# Patient Record
Sex: Female | Born: 1965 | Race: Asian | Hispanic: No | Marital: Married | Smoking: Never smoker
Health system: Southern US, Community
[De-identification: ages and names within clinical notes are randomized; demographics above are authoritative.]

## PROBLEM LIST (undated history)

## (undated) DIAGNOSIS — R7303 Prediabetes: Secondary | ICD-10-CM

## (undated) DIAGNOSIS — C50919 Malignant neoplasm of unspecified site of unspecified female breast: Secondary | ICD-10-CM

## (undated) DIAGNOSIS — E785 Hyperlipidemia, unspecified: Secondary | ICD-10-CM

## (undated) DIAGNOSIS — E079 Disorder of thyroid, unspecified: Secondary | ICD-10-CM

## (undated) DIAGNOSIS — I10 Essential (primary) hypertension: Secondary | ICD-10-CM

## (undated) HISTORY — DX: Hyperlipidemia, unspecified: E78.5

## (undated) HISTORY — DX: Malignant neoplasm of unspecified site of unspecified female breast: C50.919

## (undated) HISTORY — PX: TONSILECTOMY, ADENOIDECTOMY, BILATERAL MYRINGOTOMY AND TUBES: SHX2538

## (undated) HISTORY — DX: Disorder of thyroid, unspecified: E07.9

---

## 1998-04-05 ENCOUNTER — Other Ambulatory Visit: Admission: RE | Admit: 1998-04-05 | Discharge: 1998-04-05 | Payer: Self-pay | Admitting: Obstetrics and Gynecology

## 1999-04-10 ENCOUNTER — Other Ambulatory Visit: Admission: RE | Admit: 1999-04-10 | Discharge: 1999-04-10 | Payer: Self-pay | Admitting: Obstetrics and Gynecology

## 2000-08-20 ENCOUNTER — Other Ambulatory Visit: Admission: RE | Admit: 2000-08-20 | Discharge: 2000-08-20 | Payer: Self-pay | Admitting: Obstetrics and Gynecology

## 2001-10-07 ENCOUNTER — Other Ambulatory Visit: Admission: RE | Admit: 2001-10-07 | Discharge: 2001-10-07 | Payer: Self-pay | Admitting: Obstetrics and Gynecology

## 2002-10-21 ENCOUNTER — Other Ambulatory Visit: Admission: RE | Admit: 2002-10-21 | Discharge: 2002-10-21 | Payer: Self-pay | Admitting: Obstetrics and Gynecology

## 2003-12-05 ENCOUNTER — Other Ambulatory Visit: Admission: RE | Admit: 2003-12-05 | Discharge: 2003-12-05 | Payer: Self-pay | Admitting: Obstetrics and Gynecology

## 2004-07-08 ENCOUNTER — Encounter: Admission: RE | Admit: 2004-07-08 | Discharge: 2004-07-08 | Payer: Self-pay | Admitting: Internal Medicine

## 2004-12-05 ENCOUNTER — Other Ambulatory Visit: Admission: RE | Admit: 2004-12-05 | Discharge: 2004-12-05 | Payer: Self-pay | Admitting: Internal Medicine

## 2004-12-23 ENCOUNTER — Emergency Department (HOSPITAL_COMMUNITY): Admission: EM | Admit: 2004-12-23 | Discharge: 2004-12-23 | Payer: Self-pay | Admitting: Emergency Medicine

## 2005-11-03 ENCOUNTER — Encounter (HOSPITAL_COMMUNITY): Admission: RE | Admit: 2005-11-03 | Discharge: 2005-12-09 | Payer: Self-pay | Admitting: Family Medicine

## 2005-12-04 ENCOUNTER — Other Ambulatory Visit: Admission: RE | Admit: 2005-12-04 | Discharge: 2005-12-04 | Payer: Self-pay | Admitting: Obstetrics and Gynecology

## 2005-12-09 ENCOUNTER — Ambulatory Visit (HOSPITAL_COMMUNITY): Admission: RE | Admit: 2005-12-09 | Discharge: 2005-12-09 | Payer: Self-pay | Admitting: Endocrinology

## 2007-06-02 ENCOUNTER — Encounter: Admission: RE | Admit: 2007-06-02 | Discharge: 2007-06-02 | Payer: Self-pay | Admitting: Occupational Medicine

## 2010-10-01 ENCOUNTER — Emergency Department (HOSPITAL_COMMUNITY)
Admission: EM | Admit: 2010-10-01 | Discharge: 2010-10-01 | Payer: Self-pay | Source: Home / Self Care | Admitting: Family Medicine

## 2012-09-28 ENCOUNTER — Ambulatory Visit
Admission: RE | Admit: 2012-09-28 | Discharge: 2012-09-28 | Disposition: A | Discharge status: Home / Self Care | Payer: BC Managed Care – PPO | Source: Ambulatory Visit | Attending: Internal Medicine | Admitting: Internal Medicine

## 2012-09-28 ENCOUNTER — Other Ambulatory Visit: Payer: Self-pay | Admitting: Internal Medicine

## 2012-09-28 DIAGNOSIS — R0602 Shortness of breath: Secondary | ICD-10-CM

## 2012-09-28 DIAGNOSIS — R079 Chest pain, unspecified: Secondary | ICD-10-CM

## 2012-09-28 MED ORDER — IOHEXOL 350 MG/ML SOLN
125.0000 mL | Freq: Once | INTRAVENOUS | Status: AC | PRN
  Administered 2012-09-28: 125 mL via INTRAVENOUS

## 2013-03-06 IMAGING — CT CT ANGIO CHEST
2 of 6 series · 19 of 36 positions shown · IV contrast ([ID] OMNI 350)
Comparison: None.

CLINICAL DATA: Chest pain and short of breath.  Concern for
pulmonary embolism.

CT ANGIOGRAPHY CHEST
TECHNIQUE: Multidetector CT imaging of the chest using the
standard protocol during bolus administration of intravenous
contrast. Multiplanar reconstructed images including MIPs were
obtained and reviewed to evaluate the vascular anatomy.
Contrast: 125mL OMNIPAQUE IOHEXOL 350 MG/ML SOLN

[Series 6: pe thin 1.25 · axial · 0.70mm/px · z∈[-210,-6]mm · 18 of 228 slices shown]
[im 12/228  lung]
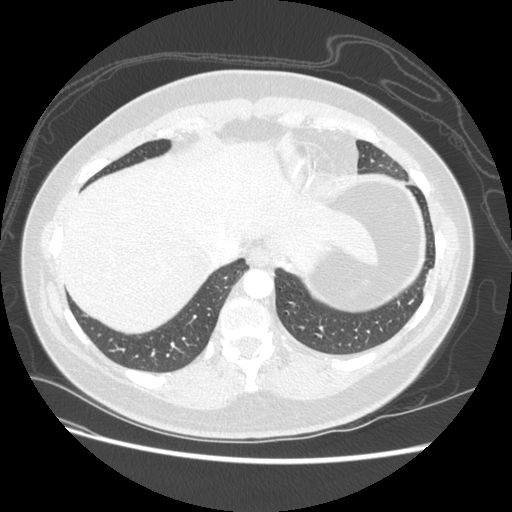
[im 24/228  mediastinal]
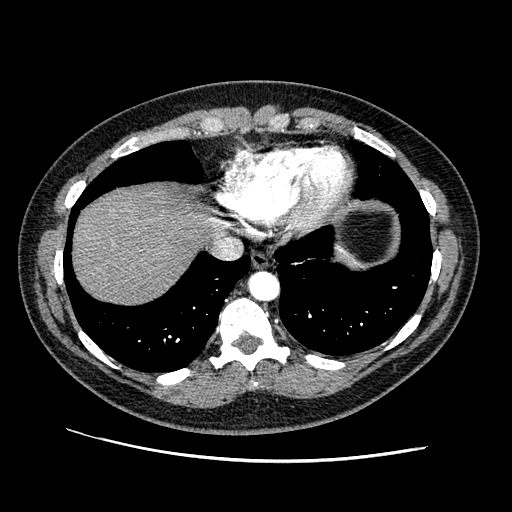
[im 36/228  lung]
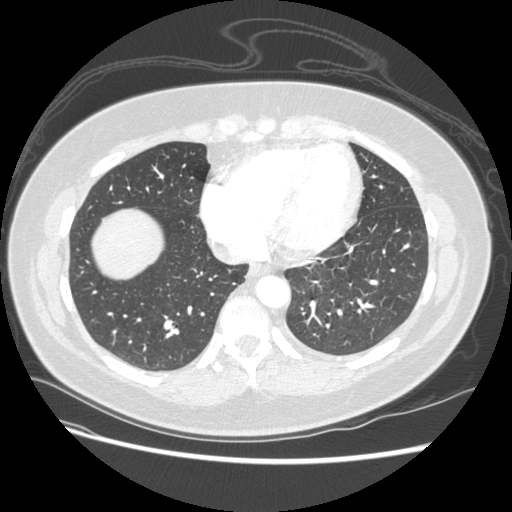
[im 48/228  mediastinal]
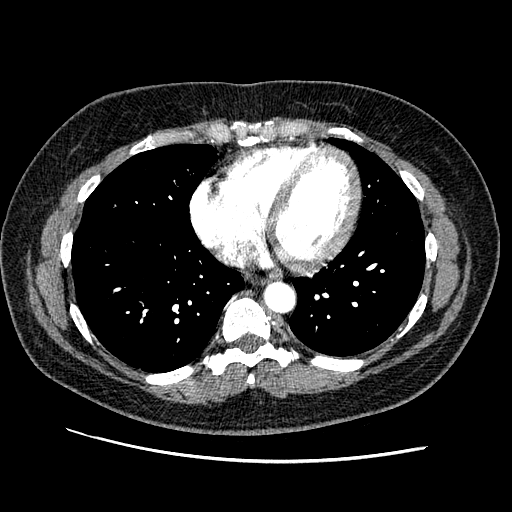
[im 60/228  lung]
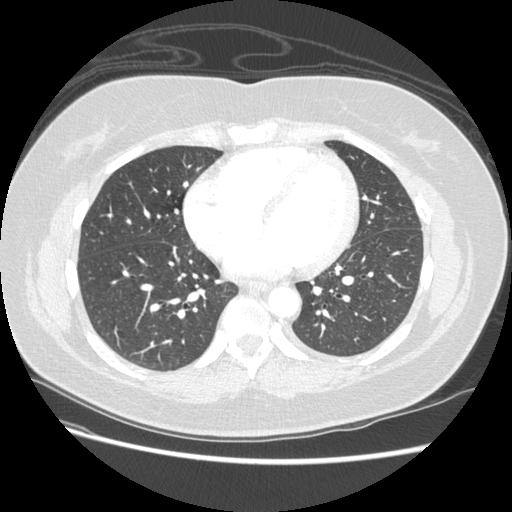
[im 72/228  mediastinal]
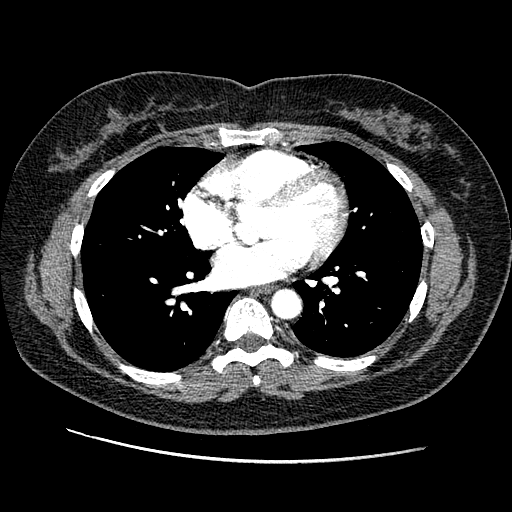
[im 84/228  lung]
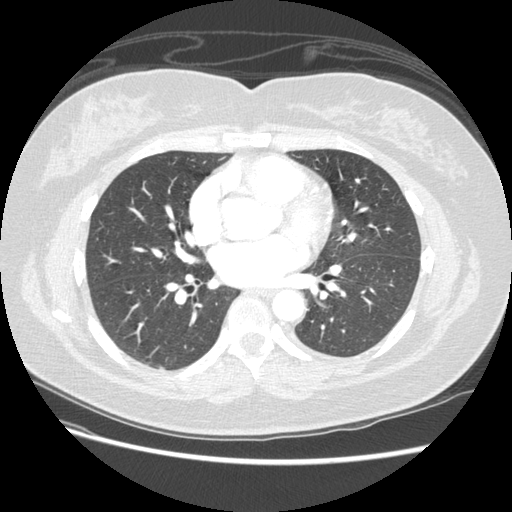
[im 96/228  mediastinal]
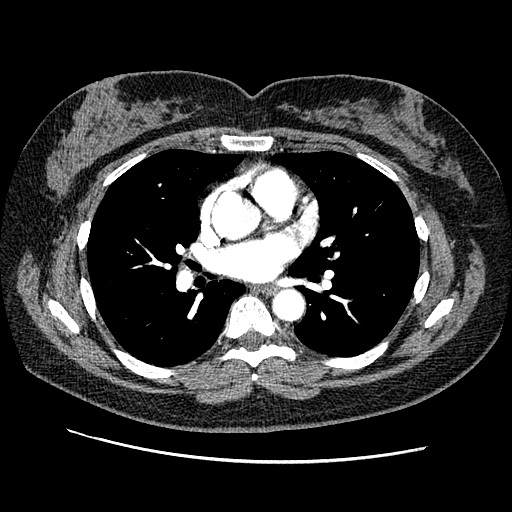
[im 108/228  lung]
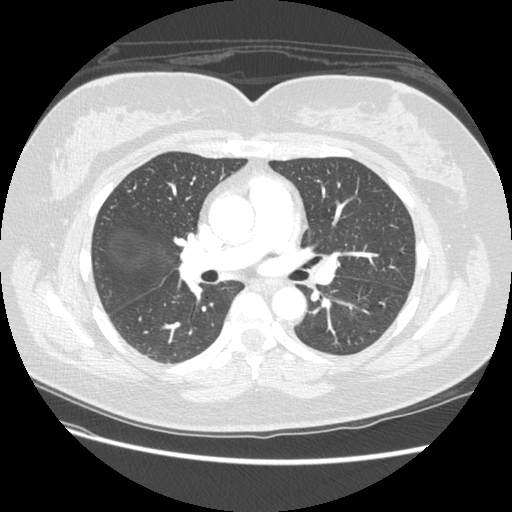
[im 120/228  mediastinal]
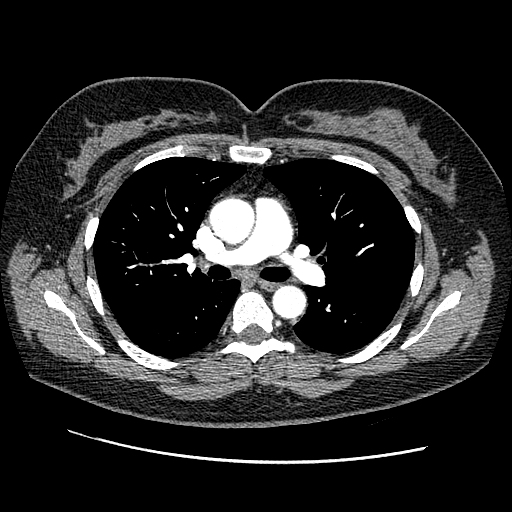
[im 132/228  lung]
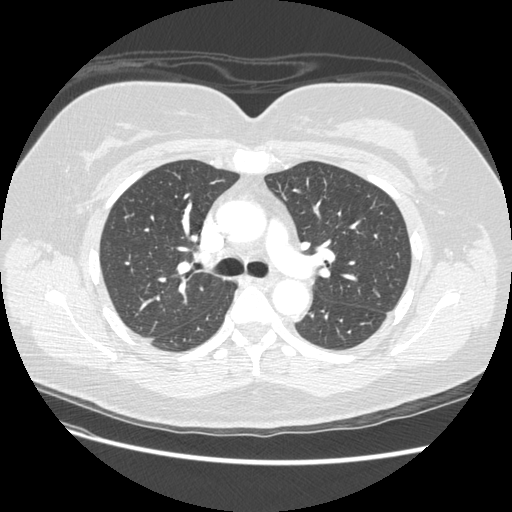
[im 144/228  mediastinal]
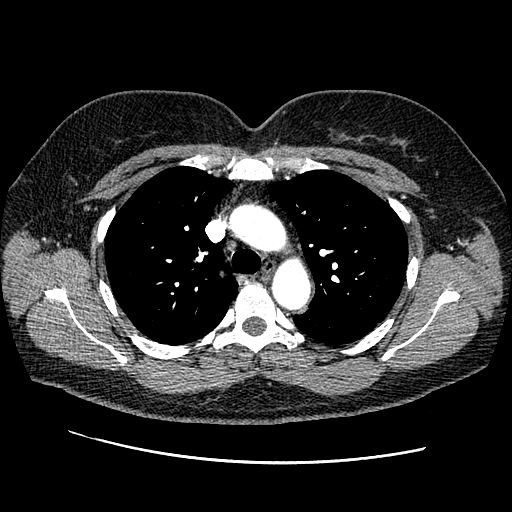
[im 156/228  lung]
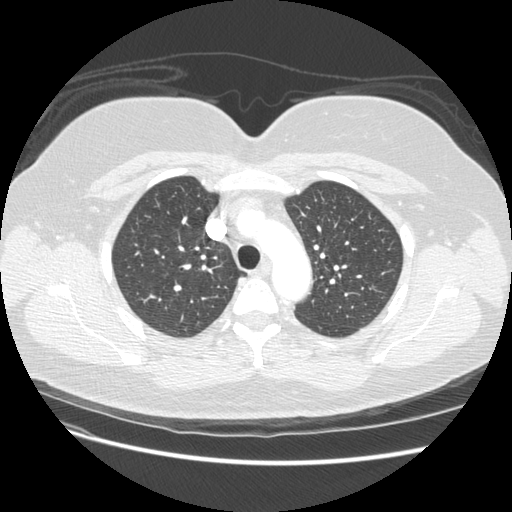
[im 168/228  mediastinal]
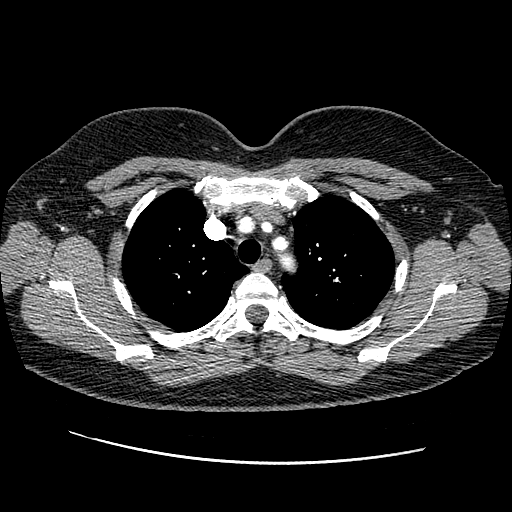
[im 180/228  lung]
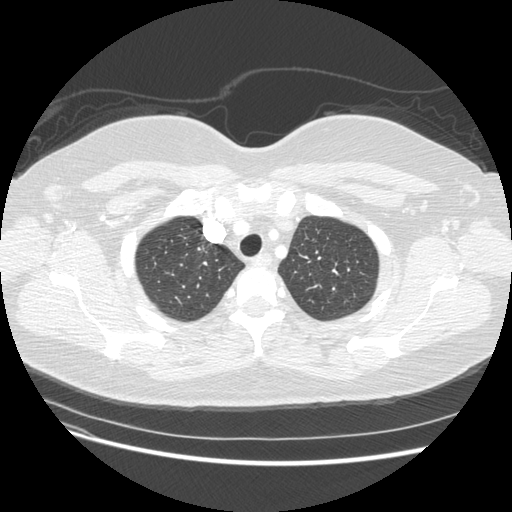
[im 192/228  mediastinal]
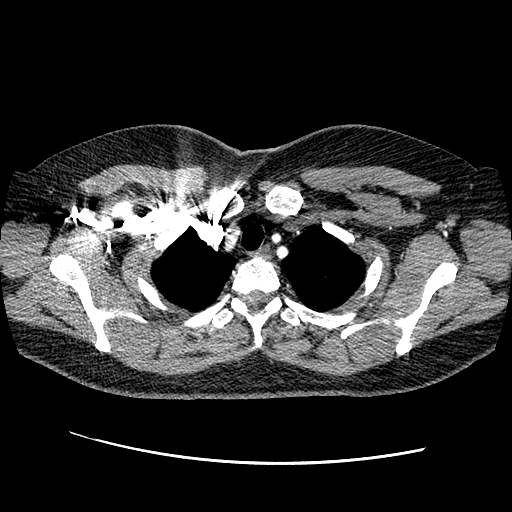
[im 204/228  lung]
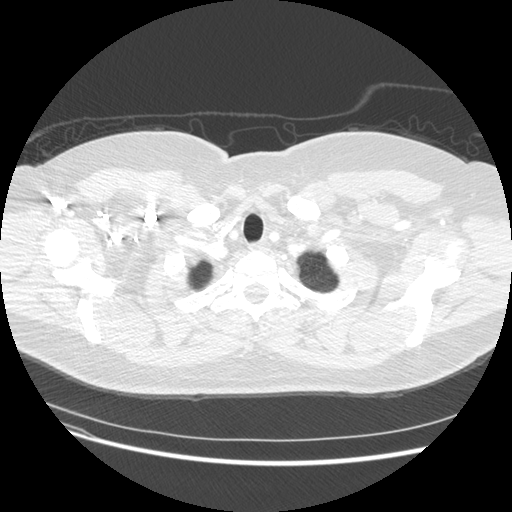
[im 216/228  mediastinal]
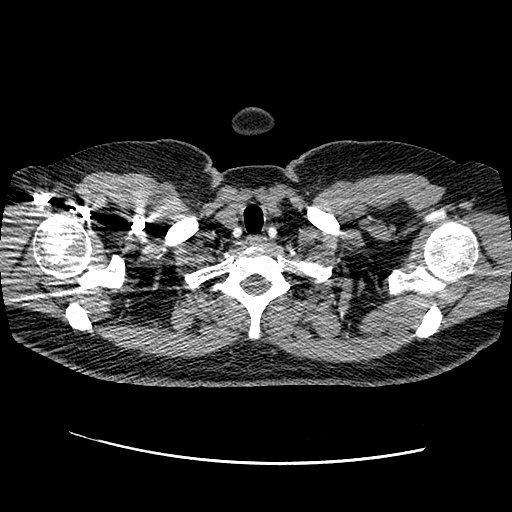

[Series 601: cor · coronal · 0.70mm/px · 1 of 105 slices shown]
[im 53/105  mediastinal]
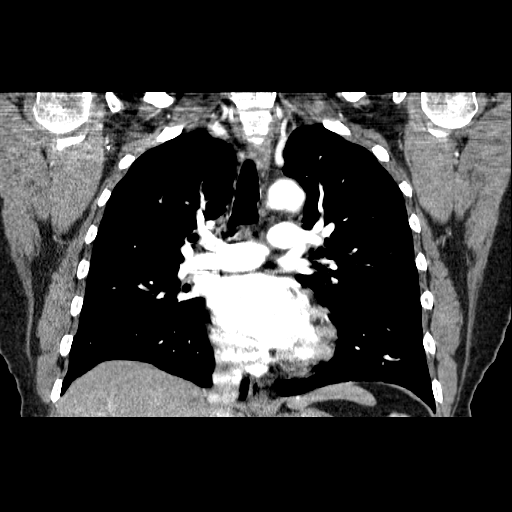

[19 of 36 positions shown; findings below may reference images not displayed]

FINDINGS: There are no filling defects within the pulmonary
arteries to suggest acute pulmonary embolism.  No acute findings of
the aorta or great vessels.  No pericardial fluid.  Esophagus is
normal.

No axillary or supraclavicular lymphadenopathy.  No mediastinal
adenopathy.

There is no pulmonary parenchymal abnormality. The airway is
normal.

 Limited view of the skeleton is unremarkable.
IMPRESSION: 1..  No evidence of acute pulmonary embolism.
2.  No acute pulmonary parenchymal abnormality.

## 2013-10-10 ENCOUNTER — Other Ambulatory Visit: Payer: Self-pay | Admitting: Obstetrics and Gynecology

## 2013-10-10 DIAGNOSIS — R928 Other abnormal and inconclusive findings on diagnostic imaging of breast: Secondary | ICD-10-CM

## 2013-10-19 ENCOUNTER — Other Ambulatory Visit: Payer: BC Managed Care – PPO

## 2013-11-03 ENCOUNTER — Ambulatory Visit
Admission: RE | Admit: 2013-11-03 | Discharge: 2013-11-03 | Disposition: A | Discharge status: Home / Self Care | Payer: BC Managed Care – PPO | Source: Ambulatory Visit | Attending: Obstetrics and Gynecology | Admitting: Obstetrics and Gynecology

## 2013-11-03 DIAGNOSIS — R928 Other abnormal and inconclusive findings on diagnostic imaging of breast: Secondary | ICD-10-CM

## 2014-06-05 ENCOUNTER — Other Ambulatory Visit: Payer: Self-pay | Admitting: Obstetrics and Gynecology

## 2014-06-05 DIAGNOSIS — R921 Mammographic calcification found on diagnostic imaging of breast: Secondary | ICD-10-CM

## 2014-06-13 ENCOUNTER — Ambulatory Visit
Admission: RE | Admit: 2014-06-13 | Discharge: 2014-06-13 | Disposition: A | Discharge status: Home / Self Care | Payer: BC Managed Care – PPO | Source: Ambulatory Visit | Attending: Obstetrics and Gynecology | Admitting: Obstetrics and Gynecology

## 2014-06-13 ENCOUNTER — Encounter (INDEPENDENT_AMBULATORY_CARE_PROVIDER_SITE_OTHER): Payer: Self-pay

## 2014-06-13 DIAGNOSIS — R921 Mammographic calcification found on diagnostic imaging of breast: Secondary | ICD-10-CM

## 2015-06-08 ENCOUNTER — Encounter (HOSPITAL_COMMUNITY): Payer: Self-pay | Admitting: *Deleted

## 2015-06-08 ENCOUNTER — Emergency Department (INDEPENDENT_AMBULATORY_CARE_PROVIDER_SITE_OTHER)
Admission: EM | Admit: 2015-06-08 | Discharge: 2015-06-08 | Disposition: A | Discharge status: Home / Self Care | Payer: Worker's Compensation | Source: Home / Self Care | Attending: Family Medicine | Admitting: Family Medicine

## 2015-06-08 DIAGNOSIS — W19XXXA Unspecified fall, initial encounter: Secondary | ICD-10-CM

## 2015-06-08 DIAGNOSIS — M549 Dorsalgia, unspecified: Secondary | ICD-10-CM | POA: Diagnosis not present

## 2015-06-08 HISTORY — DX: Essential (primary) hypertension: I10

## 2015-06-08 MED ORDER — IBUPROFEN 800 MG PO TABS: 800.0000 mg | ORAL_TABLET | Freq: Three times a day (TID) | ORAL | Status: DC

## 2015-06-08 MED ORDER — CYCLOBENZAPRINE HCL 5 MG PO TABS: 5.0000 mg | ORAL_TABLET | Freq: Three times a day (TID) | ORAL | Status: DC | PRN
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
## 2015-06-08 NOTE — Discharge Instructions (Signed)
Heat, medicine as needed, regular activity, ok to work as scheduled. Return as needed

## 2015-09-17 ENCOUNTER — Other Ambulatory Visit: Payer: Self-pay | Admitting: Ophthalmology

## 2015-09-17 DIAGNOSIS — H02409 Unspecified ptosis of unspecified eyelid: Secondary | ICD-10-CM

## 2017-07-22 ENCOUNTER — Other Ambulatory Visit: Payer: Self-pay | Admitting: Obstetrics and Gynecology

## 2017-07-22 DIAGNOSIS — Z139 Encounter for screening, unspecified: Secondary | ICD-10-CM

## 2018-10-14 ENCOUNTER — Encounter: Payer: Self-pay | Admitting: Internal Medicine

## 2019-07-21 ENCOUNTER — Other Ambulatory Visit: Payer: Self-pay

## 2019-07-21 DIAGNOSIS — Z20822 Contact with and (suspected) exposure to covid-19: Secondary | ICD-10-CM

## 2019-07-23 LAB — NOVEL CORONAVIRUS, NAA: SARS-CoV-2, NAA: DETECTED — AB

## 2019-10-04 ENCOUNTER — Other Ambulatory Visit: Payer: Self-pay | Admitting: Internal Medicine

## 2019-10-04 DIAGNOSIS — Z1231 Encounter for screening mammogram for malignant neoplasm of breast: Secondary | ICD-10-CM

## 2019-11-28 ENCOUNTER — Ambulatory Visit: Payer: BC Managed Care – PPO

## 2020-10-04 NOTE — Progress Notes (Signed)
Cardiology Office Note:   Date:  10/05/2020  NAME:  Jacqueline Mayer    MRN: [id number] DOB:  [birth year]   PCP:  Deland Pretty, MD  Cardiologist:  No primary care provider on file.  Electrophysiologist:  None   Referring MD: Deland Pretty, MD   Chief Complaint  Patient presents with  . Coronary Artery Disease   History of Present Illness:   Jacqueline Mayer is a 55 y.o. female with a hx of HTN, coronary calcium who is being seen today for the evaluation of CAD at the request of Deland Pretty, MD.  Recent coronary calcium score 174.  90th percentile.  Further evaluation with [place]. Medical history significant for hypertension hyperlipidemia.  Most recent LDL 168.  She also has hypertension which is well controlled on medication.  She reports she does not exercise routinely but denies chest pain or shortness of breath.  She reports she can get out of breath when she exerts herself but this is likely due to deconditioning.  She is exercise.  She reports she recently relocated to [place] in an apartment.  Her husband and she have downsized.  She reports she is eating out and not exercising at all.  She works as a Biomedical scientist at [employer].  She commutes.  She is a mother of 1 daughter.  No history of heart attack or stroke.  Her mother had heart disease.  She also has a history of thyroid disease and takes thyroid supplementation.  Blood pressure in office 123/71.  She takes bisoprolol and HCTZ.  She is never smoker.  She does drink alcohol daily.  2 to 3 glasses of wine per day.  Up to a fifth of liquor on the weekends.  She is good to work on cutting down.  She is also been to work on reducing fast food consumption.  She does describe intermittent sensation of flushing in her neck.  Does not occur with exertion.  Occurs at any time.  No exertional chest pain reported.  Problem List 1. CAD/Elevated calcium score  -09/12/2020 ([facility]) -CAC=174 (98th percentile) 2. HLD -HDL  60, LDL 168, triglycerides 122, A1c 5.3. 3. HTN   Past Medical History: Past Medical History:  Diagnosis Date  . Hyperlipidemia   . Hypertension   . Thyroid disease     Past Surgical History: Past Surgical History:  Procedure Laterality Date  . CESAREAN SECTION      Current Medications: Current Meds  Medication Sig  . aspirin EC 81 MG tablet Take 1 tablet (81 mg total) by mouth daily. Swallow whole.  . bisoprolol-hydrochlorothiazide (ZIAC) 10-6.25 MG tablet Take 1 tablet by mouth daily.  Marland Kitchen BISOPROLOL-HYDROCHLOROTHIAZIDE PO Take by mouth.  Marland Kitchen LEVOTHYROXINE SODIUM PO Take by mouth.  . medroxyPROGESTERone (DEPO-PROVERA) 150 MG/ML injection medroxyprogesterone 150 mg/mL intramuscular suspension  ADMINISTER 1 ML IN THE MUSCLE EVERY 3 MONTHS  . rosuvastatin (CRESTOR) 20 MG tablet Take 1 tablet (20 mg total) by mouth daily.  . Semaglutide, 1 MG/DOSE, (OZEMPIC, 1 MG/DOSE,) 4 MG/3ML SOPN INJECT 1 MG INTO SKIN EVERY WEEKLY  . [DISCONTINUED] cyclobenzaprine (FLEXERIL) 5 MG tablet Take 1 tablet (5 mg total) by mouth 3 (three) times daily as needed for muscle spasms.  . [DISCONTINUED] ibuprofen (ADVIL,MOTRIN) 800 MG tablet Take 1 tablet (800 mg total) by mouth 3 (three) times daily.     Allergies:    Patient has no allergy information on record.   Social History: Social History   Socioeconomic  History  . Marital status: Married    Spouse name: Not on file  . Number of children: 1  . Years of education: Not on file  . Highest education level: Not on file  Occupational History  . Occupation: Scientist, physiological at [employer]  Tobacco Use  . Smoking status: Never Smoker  . Smokeless tobacco: Never Used  Substance and Sexual Activity  . Alcohol use: Yes    Alcohol/week: 3.0 standard drinks    Types: 3 Glasses of wine per week  . Drug use: Never  . Sexual activity: Yes  Other Topics Concern  . Not on file  Social History Narrative  . Not on file   Social Determinants of Health    Financial Resource Strain: Not on file  Food Insecurity: Not on file  Transportation Needs: Not on file  Physical Activity: Not on file  Stress: Not on file  Social Connections: Not on file     Family History: The patient's family history includes Diabetes in her father and mother; Heart attack in her mother.  ROS:   All other ROS reviewed and negative. Pertinent positives noted in the HPI.     EKGs/Labs/Other Studies Reviewed:   The following studies were personally reviewed by me today:  EKG:  EKG is ordered today.  The ekg ordered today demonstrates normal sinus rhythm heart rate 80s nonspecific ST-T changes, and was personally reviewed by me.   Recent Labs: No results found for requested labs within last 8760 hours.   Recent Lipid Panel No results found for: CHOL, TRIG, HDL, CHOLHDL, VLDL, LDLCALC, LDLDIRECT  Physical Exam:   VS:  BP 123/71   Pulse 79   Ht 5\' 3"  (1.6 m)   Wt 156 lb 12.8 oz (71.1 kg)   SpO2 98%   BMI 27.78 kg/m    Wt Readings from Last 3 Encounters:  10/05/20 156 lb 12.8 oz (71.1 kg)    General: Well nourished, well developed, in no acute distress Head: Atraumatic, normal size  Eyes: PEERLA, EOMI  Neck: Supple, no JVD Endocrine: No thryomegaly Cardiac: Normal S1, S2; RRR; no murmurs, rubs, or gallops Lungs: Clear to auscultation bilaterally, no wheezing, rhonchi or rales  Abd: Soft, nontender, no hepatomegaly  Ext: No edema, pulses 2+ Musculoskeletal: No deformities, BUE and BLE strength normal and equal Skin: Warm and dry, no rashes   Neuro: Alert and oriented to person, place, time, and situation, CNII-XII grossly intact, no focal deficits  Psych: Normal mood and affect   ASSESSMENT:   Jacqueline Mayer is a 55 y.o. female who presents for the following: 1. Coronary artery disease involving native coronary artery of native heart without angina pectoris   2. Agatston coronary artery calcium score between 100 and 199   3. Mixed hyperlipidemia    4. Primary hypertension     PLAN:   1. Coronary artery disease involving native coronary artery of native heart without angina pectoris 2. Agatston coronary artery calcium score between 100 and 199 3. Mixed hyperlipidemia -Coronary calcium score 174 which is 90th percentile.  She should take aspirin 81 mg daily.  LDL cholesterol needs to be less than 70.  Start Crestor 20 mg daily.  We will see her back in 3 months and she will give [place] a lipid profile and liver profile and TSH 1 week before that appointment.  She will be fasting. -No chest pain or significant shortness of breath.  EKG demonstrates sinus rhythm with nonspecific ST-T changes.  She reports intermittent neck pain.  We will proceed with an exercise treadmill stress test just to make sure there is no obstructive disease. -Counseled on 150 minutes of exercise per week. -Advised to cut back on fast food intake.  She will work on improving her diet. -She should also avoid excess alcohol consumption.  4. Primary hypertension -Blood pressure well controlled on bisoprolol-HCTZ.  Shared Decision Making/Informed Consent The risks [chest pain, shortness of breath, cardiac arrhythmias, dizziness, blood pressure fluctuations, myocardial infarction, stroke/transient ischemic attack, and life-threatening complications (estimated to be 1 in 10,000)], benefits (risk stratification, diagnosing coronary artery disease, treatment guidance) and alternatives of an exercise tolerance test were discussed in detail with Ms. [place] and she agrees to proceed.  Disposition: Return in about 3 months (around 01/02/2021).  Medication Adjustments/Labs and Tests Ordered: Current medicines are reviewed at length with the patient today.  Concerns regarding medicines are outlined above.  Orders Placed This Encounter  Procedures  . Lipid panel  . TSH  . Cardiac Stress Test: Informed Consent Details: Physician/Practitioner Attestation; Transcribe to consent form  and obtain patient signature  . EXERCISE TOLERANCE TEST (ETT)  . EKG 12-Lead   Meds ordered this encounter  Medications  . aspirin EC 81 MG tablet    Sig: Take 1 tablet (81 mg total) by mouth daily. Swallow whole.    Dispense:  90 tablet    Refill:  3  . rosuvastatin (CRESTOR) 20 MG tablet    Sig: Take 1 tablet (20 mg total) by mouth daily.    Dispense:  90 tablet    Refill:  3    Patient Instructions  Medication Instructions:  Start Aspirin 81 mg daily  Start Crestor 20 mg daily   *If you need a refill on your cardiac medications before your next appointment, please call your pharmacy*   Lab Work: LIPID, TSH (1 week before follow up appointment in 3 months, no lab appointment needed, come fasting-nothing to eat or drink)   COVID TESTING NEEDED  If you have labs (blood work) drawn today and your tests are completely normal, you will receive your results only by: Marland Kitchen MyChart Message (if you have MyChart) OR . A paper copy in the mail If you have any lab test that is abnormal or we need to change your treatment, we will call you to review the results.   Testing/Procedures: Your physician has requested that you have an exercise tolerance test, this is a screening tool to track your fitness level. This test evaluates the your exercise capacity by measuring cardiovascular response to exercise, the stress response is induced by exercise (exercise-treadmill).  Graded exercise test is also known as maximal exercise test or stress EKG test  . Please also follow instruction sheet given.    Follow-Up: At [facility], you and your health needs are our priority.  As part of our continuing mission to provide you with exceptional heart care, we have created designated Provider Care Teams.  These Care Teams include your primary Cardiologist (physician) and Advanced Practice Providers (APPs -  Physician Assistants and Nurse Practitioners) who all work together to provide you with the care  you need, when you need it.  We recommend signing up for the patient portal called "MyChart".  Sign up information is provided on this After Visit Summary.  MyChart is used to connect with patients for Virtual Visits (Telemedicine).  Patients are able to view lab/test results, encounter notes, upcoming appointments, etc.  Non-urgent messages can be  sent to your provider as well.   To learn more about what you can do with MyChart, go to [email or web address].    Your next appointment:   3 month(s)  The format for your next appointment:   In Person  Provider:   Eleonore Chiquito, MD          Signed, Addison Naegeli. Audie Box, [place]  [address] [place], [place] [address] [phone]  10/05/2020 10:28 AM

## 2020-10-05 ENCOUNTER — Ambulatory Visit (INDEPENDENT_AMBULATORY_CARE_PROVIDER_SITE_OTHER): Payer: Self-pay | Admitting: Cardiovascular Disease

## 2020-10-05 ENCOUNTER — Encounter: Payer: Self-pay | Admitting: Cardiovascular Disease

## 2020-10-05 ENCOUNTER — Other Ambulatory Visit: Payer: Self-pay

## 2020-10-05 VITALS — BP 123/71 | HR 79 | Ht 63.0 in | Wt 156.8 lb

## 2020-10-05 DIAGNOSIS — I10 Essential (primary) hypertension: Secondary | ICD-10-CM

## 2020-10-05 DIAGNOSIS — R931 Abnormal findings on diagnostic imaging of heart and coronary circulation: Secondary | ICD-10-CM

## 2020-10-05 DIAGNOSIS — E782 Mixed hyperlipidemia: Secondary | ICD-10-CM

## 2020-10-05 DIAGNOSIS — I2510 Atherosclerotic heart disease of native coronary artery without angina pectoris: Secondary | ICD-10-CM

## 2020-10-05 MED ORDER — ROSUVASTATIN CALCIUM 20 MG PO TABS: 20.0000 mg | ORAL_TABLET | Freq: Every day | ORAL | 3 refills | Status: DC

## 2020-10-05 MED ORDER — ASPIRIN EC 81 MG PO TBEC: 81.0000 mg | DELAYED_RELEASE_TABLET | Freq: Every day | ORAL | 3 refills | Status: DC

## 2020-10-05 NOTE — Patient Instructions (Signed)
Medication Instructions:  Start Aspirin 81 mg daily  Start Crestor 20 mg daily   *If you need a refill on your cardiac medications before your next appointment, please call your pharmacy*   Lab Work: LIPID, TSH (1 week before follow up appointment in 3 months, no lab appointment needed, come fasting-nothing to eat or drink)   COVID TESTING NEEDED  If you have labs (blood work) drawn today and your tests are completely normal, you will receive your results only by: Marland Kitchen MyChart Message (if you have MyChart) OR . A paper copy in the mail If you have any lab test that is abnormal or we need to change your treatment, we will call you to review the results.   Testing/Procedures: Your physician has requested that you have an exercise tolerance test, this is a screening tool to track your fitness level. This test evaluates the your exercise capacity by measuring cardiovascular response to exercise, the stress response is induced by exercise (exercise-treadmill).  Graded exercise test is also known as maximal exercise test or stress EKG test  . Please also follow instruction sheet given.    Follow-Up: At [facility], you and your health needs are our priority.  As part of our continuing mission to provide you with exceptional heart care, we have created designated Provider Care Teams.  These Care Teams include your primary Cardiologist (physician) and Advanced Practice Providers (APPs -  Physician Assistants and Nurse Practitioners) who all work together to provide you with the care you need, when you need it.  We recommend signing up for the patient portal called "MyChart".  Sign up information is provided on this After Visit Summary.  MyChart is used to connect with patients for Virtual Visits (Telemedicine).  Patients are able to view lab/test results, encounter notes, upcoming appointments, etc.  Non-urgent messages can be sent to your provider as well.   To learn more about what you can do  with MyChart, go to [email or web address].    Your next appointment:   3 month(s)  The format for your next appointment:   In Person  Provider:   Eleonore Chiquito, MD

## 2020-10-19 ENCOUNTER — Telehealth (HOSPITAL_COMMUNITY): Payer: Self-pay | Admitting: *Deleted

## 2020-10-19 ENCOUNTER — Other Ambulatory Visit (HOSPITAL_COMMUNITY)
Admission: RE | Admit: 2020-10-19 | Discharge: 2020-10-19 | Disposition: A | Discharge status: Home / Self Care | Payer: BC Managed Care – PPO | Source: Ambulatory Visit | Attending: Cardiovascular Disease | Admitting: Cardiovascular Disease

## 2020-10-19 DIAGNOSIS — Z20822 Contact with and (suspected) exposure to covid-19: Secondary | ICD-10-CM | POA: Insufficient documentation

## 2020-10-19 DIAGNOSIS — Z01812 Encounter for preprocedural laboratory examination: Secondary | ICD-10-CM | POA: Insufficient documentation

## 2020-10-19 NOTE — Telephone Encounter (Signed)
Close encounter 

## 2020-10-20 LAB — SARS CORONAVIRUS 2 (TAT 6-24 HRS): SARS Coronavirus 2: NEGATIVE

## 2020-10-23 ENCOUNTER — Ambulatory Visit (HOSPITAL_COMMUNITY)
Admission: RE | Admit: 2020-10-23 | Discharge: 2020-10-23 | Disposition: A | Discharge status: Home / Self Care | Payer: BC Managed Care – PPO | Source: Ambulatory Visit | Attending: Cardiovascular Disease | Admitting: Cardiovascular Disease

## 2020-10-23 ENCOUNTER — Other Ambulatory Visit: Payer: Self-pay

## 2020-10-23 DIAGNOSIS — I2510 Atherosclerotic heart disease of native coronary artery without angina pectoris: Secondary | ICD-10-CM | POA: Diagnosis not present

## 2020-10-23 LAB — EXERCISE TOLERANCE TEST
Estimated workload: 10.1 METS
Exercise duration (min): 9 min
Exercise duration (sec): 1 s
MPHR: 167 {beats}/min
Peak HR: 173 {beats}/min
Percent HR: 104 %
Rest HR: 76 {beats}/min

## 2020-12-24 LAB — LIPID PANEL
Chol/HDL Ratio: 3 ratio (ref 0.0–4.4)
Cholesterol, Total: 173 mg/dL (ref 100–199)
HDL: 58 mg/dL (ref >39)
LDL Chol Calc (NIH): 93 mg/dL (ref 0–99)
Triglycerides: 127 mg/dL (ref 0–149)
VLDL Cholesterol Cal: 22 mg/dL (ref 5–40)

## 2020-12-24 LAB — TSH: TSH: 6.82 u[IU]/mL — ABNORMAL HIGH (ref 0.450–4.500)

## 2021-01-01 NOTE — Progress Notes (Signed)
Cardiology Office Note:   Date:  01/02/2021  NAME:  Jacqueline Mayer    MRN: [id number] DOB:  1965/09/03   PCP:  Deland Pretty, MD  Cardiologist:  None  Electrophysiologist:  None   Referring MD: Deland Pretty, MD   Chief Complaint  Patient presents with  . Follow-up   History of Present Illness:   Jacqueline Mayer is a 55 y.o. female with a hx of CAD, elevated calcium score, HLD who presents for follow-up. ETT normal.  She reports she is doing well.  Her neck pain went away.  ETT was normal.  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  She also has a grand child on the way this summer.  She has a lot to look forward to.  She reports she is doing walking but no structured exercise.  She is still living in downtown [place].  They have plans to possibly purchase a house once the housing market improves.  She reports that she overall has no symptoms of chest pain or shortness of breath.  She is working on her diet.  We did discuss her cholesterol results in the office.  Would like her LDL cholesterol bit lower.  She is tolerating Crestor well without any major issues.  She is okay to increase this to 40 mg daily.  She denies any significant symptoms in the office today.  Problem List 1. CAD/Elevated calcium score  -09/12/2020 ([facility]) -CAC=174 (98th percentile) -ETT 10/23/2020 9:01 min:s; 10.1 METs; negative for ischemia  2. HLD -T chol 158, HDL 58, LDL 93, TG 127 3. HTN  Past Medical History: Past Medical History:  Diagnosis Date  . Hyperlipidemia   . Hypertension   . Thyroid disease     Past Surgical History: Past Surgical History:  Procedure Laterality Date  . CESAREAN SECTION      Current Medications: Current Meds  Medication Sig  . aspirin EC 81 MG tablet Take 1 tablet (81 mg total) by mouth daily. Swallow whole.  . bisoprolol-hydrochlorothiazide (ZIAC) 10-6.25 MG tablet Take 1 tablet by mouth  daily.  Marland Kitchen LEVOTHYROXINE SODIUM PO Take 88 mcg by mouth daily.  . medroxyPROGESTERone (DEPO-PROVERA) 150 MG/ML injection medroxyprogesterone 150 mg/mL intramuscular suspension  ADMINISTER 1 ML IN THE MUSCLE EVERY 3 MONTHS  . Semaglutide, 1 MG/DOSE, (OZEMPIC, 1 MG/DOSE,) 4 MG/3ML SOPN INJECT 1 MG INTO SKIN EVERY WEEKLY  . [DISCONTINUED] rosuvastatin (CRESTOR) 20 MG tablet Take 1 tablet (20 mg total) by mouth daily.     Allergies:    Sulfa antibiotics   Social History: Social History   Socioeconomic History  . Marital status: Married    Spouse name: Not on file  . Number of children: 1  . Years of education: Not on file  . Highest education level: Not on file  Occupational History  . Occupation: Scientist, physiological at [employer]  Tobacco Use  . Smoking status: Never Smoker  . Smokeless tobacco: Never Used  Substance and Sexual Activity  . Alcohol use: Yes    Alcohol/week: 3.0 standard drinks    Types: 3 Glasses of wine per week  . Drug use: Never  . Sexual activity: Yes  Other Topics Concern  . Not on file  Social History Narrative  . Not on file   Social Determinants of Health   Financial Resource Strain: Not on file  Food Insecurity: Not on file  Transportation Needs:  Not on file  Physical Activity: Not on file  Stress: Not on file  Social Connections: Not on file     Family History: The patient's family history includes Diabetes in her father and mother; Heart attack in her mother.  ROS:   All other ROS reviewed and negative. Pertinent positives noted in the HPI.     EKGs/Labs/Other Studies Reviewed:   The following studies were personally reviewed by me today:  Recent Labs: 12/24/2020: TSH 6.820   Recent Lipid Panel    Component Value Date/Time   CHOL 173 12/24/2020 0836   TRIG 127 12/24/2020 0836   HDL 58 12/24/2020 0836   CHOLHDL 3.0 12/24/2020 0836   LDLCALC 93 12/24/2020 0836    Physical Exam:   VS:  BP 118/88 (BP Location: Right Arm, Patient  Position: Sitting, Cuff Size: Normal)   Pulse 74   Ht 5\' 3"  (1.6 m)   Wt 153 lb (69.4 kg)   BMI 27.10 kg/m    Wt Readings from Last 3 Encounters:  01/02/21 153 lb (69.4 kg)  10/05/20 156 lb 12.8 oz (71.1 kg)    General: Well nourished, well developed, in no acute distress Head: Atraumatic, normal size  Eyes: PEERLA, EOMI  Neck: Supple, no JVD Endocrine: No thryomegaly Cardiac: Normal S1, S2; RRR; no murmurs, rubs, or gallops Lungs: Clear to auscultation bilaterally, no wheezing, rhonchi or rales  Abd: Soft, nontender, no hepatomegaly  Ext: No edema, pulses 2+ Musculoskeletal: No deformities, BUE and BLE strength normal and equal Skin: Warm and dry, no rashes   Neuro: Alert and oriented to person, place, time, and situation, CNII-XII grossly intact, no focal deficits  Psych: Normal mood and affect   ASSESSMENT:   Jacqueline Mayer is a 55 y.o. female who presents for the following: 1. Agatston coronary artery calcium score between 100 and 199   2. Mixed hyperlipidemia     PLAN:   1. Agatston coronary artery calcium score between 100 and 199 2. Mixed hyperlipidemia -Coronary calcium score 174 which is 98 percentile.  Exercise treadmill stress test was negative.  No symptoms concerning for underlying angina. -LDL cholesterol has improved to 93 on 20 mg of Crestor.  Would like her LDL less than 70.  No side effects of this.  We will increase it to 40 mg daily.  This will get her to goal.  She will see [place] back yearly. -Blood pressure is well controlled in office today.  She overall is doing well.  Disposition: Return in about 1 year (around 01/02/2022).  Medication Adjustments/Labs and Tests Ordered: Current medicines are reviewed at length with the patient today.  Concerns regarding medicines are outlined above.  No orders of the defined types were placed in this encounter.  Meds ordered this encounter  Medications  . rosuvastatin (CRESTOR) 40 MG tablet    Sig: Take 1 tablet (40  mg total) by mouth daily.    Dispense:  90 tablet    Refill:  3    Patient Instructions  Medication Instructions:  Increase Crestor to 40 mg daily   *If you need a refill on your cardiac medications before your next appointment, please call your pharmacy*   Follow-Up: At [facility], you and your health needs are our priority.  As part of our continuing mission to provide you with exceptional heart care, we have created designated Provider Care Teams.  These Care Teams include your primary Cardiologist (physician) and Advanced Practice Providers (APPs -  Physician Assistants  and Nurse Practitioners) who all work together to provide you with the care you need, when you need it.  We recommend signing up for the patient portal called "MyChart".  Sign up information is provided on this After Visit Summary.  MyChart is used to connect with patients for Virtual Visits (Telemedicine).  Patients are able to view lab/test results, encounter notes, upcoming appointments, etc.  Non-urgent messages can be sent to your provider as well.   To learn more about what you can do with MyChart, go to [email or web address].    Your next appointment:   12 month(s)  The format for your next appointment:   In Person  Provider:   You may see Dr.O'Neal or one of the following Advanced Practice Providers on your designated Care Team:    Almyra Deforest, PA-C  Fabian Sharp, [place] or   Roby Lofts, PA-C      Time Spent with Patient: I have spent a total of 25 minutes with patient reviewing hospital notes, telemetry, EKGs, labs and examining the patient as well as establishing an assessment and plan that was discussed with the patient.  > 50% of time was spent in direct patient care.  Signed, Addison Naegeli. Audie Box, MD, [place]  [address] [place], [place] [address] [phone]  01/02/2021 9:20 AM

## 2021-01-02 ENCOUNTER — Ambulatory Visit: Payer: BC Managed Care – PPO | Admitting: Cardiovascular Disease

## 2021-01-02 ENCOUNTER — Other Ambulatory Visit: Payer: Self-pay

## 2021-01-02 ENCOUNTER — Encounter: Payer: Self-pay | Admitting: Cardiovascular Disease

## 2021-01-02 VITALS — BP 118/88 | HR 74 | Ht 63.0 in | Wt 153.0 lb

## 2021-01-02 DIAGNOSIS — R931 Abnormal findings on diagnostic imaging of heart and coronary circulation: Secondary | ICD-10-CM | POA: Diagnosis not present

## 2021-01-02 DIAGNOSIS — E782 Mixed hyperlipidemia: Secondary | ICD-10-CM

## 2021-01-02 MED ORDER — ROSUVASTATIN CALCIUM 40 MG PO TABS: 40.0000 mg | ORAL_TABLET | Freq: Every day | ORAL | 3 refills | Status: DC

## 2021-01-02 NOTE — Patient Instructions (Signed)
Medication Instructions:  Increase Crestor to 40 mg daily   *If you need a refill on your cardiac medications before your next appointment, please call your pharmacy*   Follow-Up: At [facility], you and your health needs are our priority.  As part of our continuing mission to provide you with exceptional heart care, we have created designated Provider Care Teams.  These Care Teams include your primary Cardiologist (physician) and Advanced Practice Providers (APPs -  Physician Assistants and Nurse Practitioners) who all work together to provide you with the care you need, when you need it.  We recommend signing up for the patient portal called "MyChart".  Sign up information is provided on this After Visit Summary.  MyChart is used to connect with patients for Virtual Visits (Telemedicine).  Patients are able to view lab/test results, encounter notes, upcoming appointments, etc.  Non-urgent messages can be sent to your provider as well.   To learn more about what you can do with MyChart, go to [email or web address].    Your next appointment:   12 month(s)  The format for your next appointment:   In Person  Provider:   You may see Dr.O'Neal or one of the following Advanced Practice Providers on your designated Care Team:    Almyra Deforest, PA-C  Fabian Sharp, PA-C or   Roby Lofts, [place]

## 2021-01-08 ENCOUNTER — Encounter: Payer: Self-pay | Admitting: Gastroenterology

## 2021-03-15 ENCOUNTER — Other Ambulatory Visit: Payer: Self-pay

## 2021-03-15 ENCOUNTER — Ambulatory Visit (AMBULATORY_SURGERY_CENTER): Payer: Self-pay | Admitting: *Deleted

## 2021-03-15 VITALS — Ht 63.0 in | Wt 160.0 lb

## 2021-03-15 DIAGNOSIS — Z86010 Personal history of colonic polyps: Secondary | ICD-10-CM

## 2021-03-15 MED ORDER — PLENVU 140 G PO SOLR: 1.0000 | Freq: Once | ORAL | 0 refills | Status: AC

## 2021-03-15 NOTE — Progress Notes (Signed)
No egg or soy allergy known to patient  No issues with past sedation with any surgeries or procedures Patient denies ever being told they had issues or difficulty with intubation  No FH of Malignant Hyperthermia No diet pills per patient No home 02 use per patient  No blood thinners per patient  Pt denies issues with constipation  No A fib or A flutter  EMMI video to pt or via [employer] 19 guidelines implemented in PV today with Pt and RN  Pt is fully vaccinated  for Covid   Coupon given to pt in PV today , Code to Pharmacy and  NO PA's for preps discussed with pt In PV today  Discussed with pt there will be an out-of-pocket cost for prep and that varies from $0 to 70 dollars   Due to the COVID-19 pandemic we are asking patients to follow certain guidelines.  Pt aware of COVID protocols and [facility] guidelines

## 2021-03-29 ENCOUNTER — Other Ambulatory Visit: Payer: Self-pay

## 2021-03-29 ENCOUNTER — Encounter: Payer: Self-pay | Admitting: Gastroenterology

## 2021-03-29 ENCOUNTER — Ambulatory Visit (AMBULATORY_SURGERY_CENTER): Payer: BC Managed Care – PPO | Admitting: Gastroenterology

## 2021-03-29 VITALS — BP 136/82 | HR 80 | Temp 98.4°F | Resp 17 | Ht 63.0 in | Wt 160.0 lb

## 2021-03-29 DIAGNOSIS — Z1211 Encounter for screening for malignant neoplasm of colon: Secondary | ICD-10-CM

## 2021-03-29 DIAGNOSIS — D123 Benign neoplasm of transverse colon: Secondary | ICD-10-CM

## 2021-03-29 DIAGNOSIS — Z86010 Personal history of colonic polyps: Secondary | ICD-10-CM

## 2021-03-29 DIAGNOSIS — Z800 Family history of malignant neoplasm of digestive organs: Secondary | ICD-10-CM | POA: Diagnosis not present

## 2021-03-29 HISTORY — PX: COLONOSCOPY: SHX174

## 2021-03-29 MED ORDER — SODIUM CHLORIDE 0.9 % IV SOLN: 500.0000 mL | Freq: Once | INTRAVENOUS | Status: AC

## 2021-03-29 NOTE — Progress Notes (Signed)
Called to room to assist during endoscopic procedure.  Patient ID and intended procedure confirmed with present staff. Received instructions for my participation in the procedure from the performing physician.  

## 2021-03-29 NOTE — Progress Notes (Signed)
0906 Robinul 0.2 mg IV given due large amount of secretions upon assessment.  MD made aware, vss

## 2021-03-29 NOTE — Op Note (Signed)
[facility] Patient Name: Beloved Cobia Procedure Date: 03/29/2021 8:55 AM MRN: [id number] Endoscopist: Thornton Park MD, MD Age: 55 Referring MD:  Date of Birth: 11/28/1965 Gender: Female Account #: [id number] Procedure:                Colonoscopy Indications:              Surveillance: Personal history of adenomatous                            polyps on last colonoscopy > 5 years ago                           History of two tubular adenomas on colonoscopy                            2003, surveillance recommended in 3 years                           Mother had cancer involving the colon and liver                           No other known family history of colon cancer or                            polyps Medicines:                Monitored Anesthesia Care Procedure:                Pre-Anesthesia Assessment:                           - Prior to the procedure, a History and Physical                            was performed, and patient medications and                            allergies were reviewed. The patient's tolerance of                            previous anesthesia was also reviewed. The risks                            and benefits of the procedure and the sedation                            options and risks were discussed with the patient.                            All questions were answered, and informed consent                            was obtained. Prior Anticoagulants: The patient has  taken no previous anticoagulant or antiplatelet                            agents. ASA Grade Assessment: II - A patient with                            mild systemic disease. After reviewing the risks                            and benefits, the patient was deemed in                            satisfactory condition to undergo the procedure.                           After obtaining informed consent, the colonoscope                            was  passed under direct vision. Throughout the                            procedure, the patient's blood pressure, pulse, and                            oxygen saturations were monitored continuously. The                            Olympus CF-HQ190L [id number]) Colonoscope was                            introduced through the anus and advanced to the 3                            cm into the ileum. A second forward view of the                            right colon was performed. The colonoscopy was                            performed without difficulty. The patient tolerated                            the procedure well. The quality of the bowel                            preparation was good. The terminal ileum, ileocecal                            valve, appendiceal orifice, and rectum were                            photographed. Scope In: 9:07:44 AM Scope Out: 9:18:53 AM Scope Withdrawal Time: 0 hours 9 minutes 10 seconds  Total Procedure Duration:  0 hours 11 minutes 9 seconds  Findings:                 Hemorrhoids were found on perianal exam.                           A 5 mm polyp was found in the hepatic flexure. The                            polyp was sessile. The polyp was removed with a                            cold snare. Resection and retrieval were complete.                            Estimated blood loss was minimal.                           The exam was otherwise without abnormality on                            direct and retroflexion views. Complications:            No immediate complications. Estimated blood loss:                            Minimal. Estimated Blood Loss:     Estimated blood loss was minimal. Impression:               - Hemorrhoids found on perianal exam.                           - One 5 mm polyp at the hepatic flexure, removed                            with a cold snare. Resected and retrieved.                           - The examination was otherwise  normal on direct                            and retroflexion views. Recommendation:           - Patient has a contact number available for                            emergencies. The signs and symptoms of potential                            delayed complications were discussed with the                            patient. Return to normal activities tomorrow.                            Written discharge instructions were provided to the  patient.                           - Resume previous diet.                           - Continue present medications.                           - Await pathology results.                           - Repeat colonoscopy in 5 years for surveillance                            given the family history.                           - Emerging evidence supports eating a diet of                            fruits, vegetables, grains, calcium, and yogurt                            while reducing red meat and alcohol may reduce the                            risk of colon cancer.                           - Thank you for allowing me to be involved in your                            colon cancer prevention. Thornton Park MD, MD 03/29/2021 9:23:36 AM This report has been signed electronically.

## 2021-03-29 NOTE — Patient Instructions (Signed)
Information on polyps and hemorrhoids given to you today.  Await pathology results.  Resume previous diet and medications.  YOU HAD AN ENDOSCOPIC PROCEDURE TODAY AT [facility]:   Refer to the procedure report that was given to you for any specific questions about what was found during the examination.  If the procedure report does not answer your questions, please call your gastroenterologist to clarify.  If you requested that your care partner not be given the details of your procedure findings, then the procedure report has been included in a sealed envelope for you to review at your convenience later.  YOU SHOULD EXPECT: Some feelings of bloating in the abdomen. Passage of more gas than usual.  Walking can help get rid of the air that was put into your GI tract during the procedure and reduce the bloating. If you had a lower endoscopy (such as a colonoscopy or flexible sigmoidoscopy) you may notice spotting of blood in your stool or on the toilet paper. If you underwent a bowel prep for your procedure, you may not have a normal bowel movement for a few days.  Please Note:  You might notice some irritation and congestion in your nose or some drainage.  This is from the oxygen used during your procedure.  There is no need for concern and it should clear up in a day or so.  SYMPTOMS TO REPORT IMMEDIATELY:  Following lower endoscopy (colonoscopy or flexible sigmoidoscopy):  Excessive amounts of blood in the stool  Significant tenderness or worsening of abdominal pains  Swelling of the abdomen that is new, acute  Fever of 100F or higher   For urgent or emergent issues, a gastroenterologist can be reached at any hour by calling [phone]. Do not use MyChart messaging for urgent concerns.    DIET:  We do recommend a small meal at first, but then you may proceed to your regular diet.  Drink plenty of fluids but you should avoid alcoholic beverages for 24  hours.  ACTIVITY:  You should plan to take it easy for the rest of today and you should NOT DRIVE or use heavy machinery until tomorrow (because of the sedation medicines used during the test).    FOLLOW UP: Our staff will call the number listed on your records 48-72 hours following your procedure to check on you and address any questions or concerns that you may have regarding the information given to you following your procedure. If we do not reach you, we will leave a message.  We will attempt to reach you two times.  During this call, we will ask if you have developed any symptoms of COVID 19. If you develop any symptoms (ie: fever, flu-like symptoms, shortness of breath, cough etc.) before then, please call [phone].  If you test positive for Covid 19 in the 2 weeks post procedure, please call and report this information to [place].    If any biopsies were taken you will be contacted by phone or by letter within the next 1-3 weeks.  Please call us at [phone] if you have not heard about the biopsies in 3 weeks.    SIGNATURES/CONFIDENTIALITY: You and/or your care partner have signed paperwork which will be entered into your electronic medical record.  These signatures attest to the fact that that the information above on your After Visit Summary has been reviewed and is understood.  Full responsibility of the confidentiality of this discharge information lies with you and/or your  care-partner.  

## 2021-03-29 NOTE — Progress Notes (Signed)
Pt's states no medical or surgical changes since previsit or office visit.  ° °Vitals CW °

## 2021-03-29 NOTE — Progress Notes (Signed)
Report given to PACU, vss 

## 2021-04-02 ENCOUNTER — Telehealth: Payer: Self-pay

## 2021-04-02 NOTE — Telephone Encounter (Signed)
  Follow up Call-  Call back number 03/29/2021  Post procedure Call Back phone  # [phone]  Permission to leave phone message Yes  Some recent data might be hidden     Patient questions:  Do you have a fever, pain , or abdominal swelling? No. Pain Score  0 *  Have you tolerated food without any problems? Yes.    Have you been able to return to your normal activities? Yes.    Do you have any questions about your discharge instructions: Diet   No. Medications  No. Follow up visit  No.  Do you have questions or concerns about your Care? No.  Actions: * If pain score is 4 or above: No action needed, pain <4.

## 2021-04-07 ENCOUNTER — Emergency Department (HOSPITAL_BASED_OUTPATIENT_CLINIC_OR_DEPARTMENT_OTHER)
Admission: EM | Admit: 2021-04-07 | Discharge: 2021-04-08 | Disposition: A | Discharge status: Home / Self Care | Payer: BC Managed Care – PPO | Attending: Emergency Medicine | Admitting: Emergency Medicine

## 2021-04-07 ENCOUNTER — Encounter (HOSPITAL_BASED_OUTPATIENT_CLINIC_OR_DEPARTMENT_OTHER): Payer: Self-pay

## 2021-04-07 DIAGNOSIS — Z79899 Other long term (current) drug therapy: Secondary | ICD-10-CM | POA: Diagnosis not present

## 2021-04-07 DIAGNOSIS — T63441A Toxic effect of venom of bees, accidental (unintentional), initial encounter: Secondary | ICD-10-CM | POA: Diagnosis present

## 2021-04-07 DIAGNOSIS — Z7982 Long term (current) use of aspirin: Secondary | ICD-10-CM | POA: Insufficient documentation

## 2021-04-07 DIAGNOSIS — I10 Essential (primary) hypertension: Secondary | ICD-10-CM | POA: Diagnosis not present

## 2021-04-07 MED ORDER — PREDNISONE 20 MG PO TABS: ORAL_TABLET | ORAL | 0 refills | Status: DC

## 2021-04-07 MED ORDER — EPINEPHRINE 0.3 MG/0.3ML IJ SOAJ: 0.3000 mg | INTRAMUSCULAR | 1 refills | Status: AC | PRN

## 2021-04-07 MED ORDER — PREDNISONE 50 MG PO TABS
60.0000 mg | ORAL_TABLET | Freq: Once | ORAL | Status: AC
  Administered 2021-04-07: 60 mg via ORAL
  Filled 2021-04-07: qty 1

## 2021-04-07 NOTE — ED Provider Notes (Signed)
[facility] EMERGENCY DEPT Provider Note   CSN: [id number] Arrival date & time: 04/07/21  2008     History Chief Complaint  Patient presents with   Allergic Reaction   Urticaria    Jacqueline Mayer is a 55 y.o. female.  HPI 55 year old female presents with allergic reaction.  At around 3:30 PM she was stung by a yellow jacket on her right lateral thigh at the pool.  30 minutes later she took 2 Benadryl and took a nap.  When she woke up she was developing hives.  Around 7:30 PM she took 2 more Benadryl.  She feels like her chest might be a little bit heavy but she denies dyspnea, cough, vomiting, diarrhea, throat/tongue swelling.  She has had localized reactions to bee stings before but never anything like this.  She is having diffuse hives with some mild itching.  Past Medical History:  Diagnosis Date   Hyperlipidemia    Hypertension    Thyroid disease     There are no problems to display for this patient.   Past Surgical History:  Procedure Laterality Date   CESAREAN SECTION     COLONOSCOPY  03/29/2021   2003   TONSILECTOMY, ADENOIDECTOMY, BILATERAL MYRINGOTOMY AND TUBES       OB History   No obstetric history on file.     Family History  Problem Relation Age of Onset   Colon polyps Mother    Heart attack Mother    Diabetes Mother    Colon polyps Father    Diabetes Father    Esophageal cancer Neg Hx    Rectal cancer Neg Hx    Stomach cancer Neg Hx    Colon cancer Neg Hx     Social History   Tobacco Use   Smoking status: Never   Smokeless tobacco: Never  Vaping Use   Vaping Use: Never used  Substance Use Topics   Alcohol use: Yes    Alcohol/week: 3.0 standard drinks    Types: 3 Glasses of wine per week   Drug use: Never    Home Medications Prior to Admission medications   Medication Sig Start Date End Date Taking? Authorizing Provider  EPINEPHrine 0.3 mg/0.3 mL IJ SOAJ injection Inject 0.3 mg into the muscle as needed for  anaphylaxis. 04/07/21  Yes Sherwood Gambler, MD  predniSONE (DELTASONE) 20 MG tablet 2 tabs po daily x 4 days 04/08/21  Yes Sherwood Gambler, MD  ALPRAZolam Duanne Moron) 0.25 MG tablet Take 0.25 mg by mouth daily as needed.    [provider]  aspirin EC 81 MG tablet Take 1 tablet (81 mg total) by mouth daily. Swallow whole. 10/05/20   Geralynn Rile, MD  bisoprolol-hydrochlorothiazide [facility]) 10-6.25 MG tablet Take 1 tablet by mouth daily. 08/30/16   [provider]  levothyroxine (SYNTHROID) 88 MCG tablet Take 88 mcg by mouth every morning. 12/27/20   [provider]  medroxyPROGESTERone (DEPO-PROVERA) 150 MG/ML injection medroxyprogesterone 150 mg/mL intramuscular suspension  ADMINISTER 1 ML IN THE MUSCLE EVERY 3 MONTHS    [provider]  rosuvastatin (CRESTOR) 40 MG tablet Take 1 tablet (40 mg total) by mouth daily. 01/02/21 04/02/21  O'NealCassie Freer, MD  Semaglutide, 1 MG/DOSE, (OZEMPIC, 1 MG/DOSE,) 4 MG/3ML SOPN INJECT 1 MG INTO SKIN EVERY WEEKLY    [provider]  zolpidem (AMBIEN) 5 MG tablet 1 tablet at bedtime. Patient not taking: Reported on 03/29/2021    [provider]    Allergies  Sulfa antibiotics  Review of Systems   Review of Systems  HENT:  Negative for sore throat, trouble swallowing and voice change.   Respiratory:  Negative for cough and shortness of breath.   Gastrointestinal:  Negative for diarrhea and vomiting.  Skin:  Positive for rash.  All other systems reviewed and are negative.  Physical Exam Updated Vital Signs BP 120/77   Pulse 98   Temp 98.3 F (36.8 C) (Oral)   Resp 17   Ht '5\' 3"'$  (1.6 m)   Wt 72.6 kg   SpO2 100%   BMI 28.34 kg/m   Physical Exam Vitals and nursing note reviewed.  Constitutional:      Appearance: She is well-developed.  HENT:     Head: Normocephalic and atraumatic.     Right Ear: External ear normal.     Left Ear: External ear normal.     Nose: Nose normal.      Mouth/Throat:     Comments: No lip/tongue/oropharyngeal swelling Clear voice Eyes:     General:        Right eye: No discharge.        Left eye: No discharge.  Cardiovascular:     Rate and Rhythm: Normal rate and regular rhythm.     Heart sounds: Normal heart sounds.  Pulmonary:     Effort: Pulmonary effort is normal.     Breath sounds: Normal breath sounds. No stridor. No wheezing or rales.  Abdominal:     General: There is no distension.     Palpations: Abdomen is soft.     Tenderness: There is no abdominal tenderness.  Skin:    General: Skin is warm and dry.     Findings: Rash present.     Comments: Diffuse scattered hives over all 4 extremities and trunk. Mildly on face Large area of oblong redness surrounding an insect sting on right lateral thigh  Neurological:     Mental Status: She is alert.  Psychiatric:        Mood and Affect: Mood is not anxious.    ED Results / Procedures / Treatments   Labs (all labs ordered are listed, but only abnormal results are displayed) Labs Reviewed - No data to display  EKG None  Radiology No results found.  Procedures Procedures   Medications Ordered in ED Medications  predniSONE (DELTASONE) tablet 60 mg (60 mg Oral Given 04/07/21 2202)    ED Course  I have reviewed the triage vital signs and the nursing notes.  Pertinent labs & imaging results that were available during my care of the patient were reviewed by me and considered in my medical decision making (see chart for details).    MDM Rules/Calculators/A&P                           Patient feels somewhat better but definitely not worse after being given prednisone and watched.  She would like to go home.  I think this is reasonable.  She has no significant symptoms besides the hives to suggest that she has anaphylaxis.  I did originally offer an EpiPen as this would undoubtedly make her feel better but might be overkill.  She would like to hold off.  I think this is  reasonable but will still prescribe an EpiPen as well as a steroid burst.  Continue Benadryl.  Follow-up with an allergist and return if symptoms worsen. Final Clinical Impression(s) / ED Diagnoses Final diagnoses:  Allergic reaction to bee sting    Rx / DC Orders ED Discharge Orders          Ordered    predniSONE (DELTASONE) 20 MG tablet        04/07/21 2302    EPINEPHrine 0.3 mg/0.3 mL IJ SOAJ injection  As needed        04/07/21 2302             Sherwood Gambler, MD 04/07/21 2306

## 2021-04-07 NOTE — ED Triage Notes (Signed)
Pt is present for an allergic reaction to a bee sting to the right outer thigh that occurred at 1530. Pt has hives all over her body and swelling of the eyes and cheeks. Pt took two 25 mg Benadryl at 1545 and two more 25 mg Benadryl at 1930 with minimal relief. Pt has never had a reaction to bees before. Airway patent, denies SOB.

## 2021-04-07 NOTE — Discharge Instructions (Addendum)
Take benadryl every 4 hours. If you develop trouble breathing, trouble swallowing or speaking, throat closing sensation, lip or tongue swelling, vomiting, or any other new/concerning symptoms then give yourself an EpiPen and call 911.  Start the prednisone tomorrow as you were given your first dose today.

## 2021-04-09 ENCOUNTER — Encounter: Payer: Self-pay | Admitting: Gastroenterology

## 2021-12-31 ENCOUNTER — Other Ambulatory Visit: Payer: Self-pay | Admitting: Cardiovascular Disease

## 2024-01-13 ENCOUNTER — Other Ambulatory Visit: Payer: Self-pay | Admitting: Obstetrics and Gynecology

## 2024-01-13 DIAGNOSIS — R928 Other abnormal and inconclusive findings on diagnostic imaging of breast: Secondary | ICD-10-CM

## 2024-01-21 ENCOUNTER — Ambulatory Visit
Admission: RE | Admit: 2024-01-21 | Discharge: 2024-01-21 | Disposition: A | Discharge status: Home / Self Care | Payer: Self-pay | Source: Ambulatory Visit | Attending: Obstetrics and Gynecology | Admitting: Obstetrics and Gynecology

## 2024-01-21 ENCOUNTER — Other Ambulatory Visit: Payer: Self-pay | Admitting: Obstetrics and Gynecology

## 2024-01-21 DIAGNOSIS — R921 Mammographic calcification found on diagnostic imaging of breast: Secondary | ICD-10-CM

## 2024-01-21 DIAGNOSIS — N6320 Unspecified lump in the left breast, unspecified quadrant: Secondary | ICD-10-CM

## 2024-01-21 DIAGNOSIS — R928 Other abnormal and inconclusive findings on diagnostic imaging of breast: Secondary | ICD-10-CM

## 2024-01-22 ENCOUNTER — Encounter: Payer: Self-pay | Admitting: Obstetrics and Gynecology

## 2024-01-22 ENCOUNTER — Ambulatory Visit
Admission: RE | Admit: 2024-01-22 | Discharge: 2024-01-22 | Disposition: A | Discharge status: Home / Self Care | Source: Ambulatory Visit | Attending: Obstetrics and Gynecology | Admitting: Obstetrics and Gynecology

## 2024-01-22 DIAGNOSIS — N6320 Unspecified lump in the left breast, unspecified quadrant: Secondary | ICD-10-CM

## 2024-01-22 DIAGNOSIS — R921 Mammographic calcification found on diagnostic imaging of breast: Secondary | ICD-10-CM

## 2024-01-22 HISTORY — PX: BREAST BIOPSY: SHX20

## 2024-01-26 LAB — SURGICAL PATHOLOGY

## 2024-01-27 ENCOUNTER — Telehealth: Payer: Self-pay | Admitting: *Deleted

## 2024-01-27 NOTE — Telephone Encounter (Signed)
 Spoke to patient to confirm upcoming morning [facility] clinic appointment on 6/4, paperwork will be sent via email.  Gave location and time, also informed patient that the surgeon's office would be calling as well to get information from them similar to the packet that they will be receiving so make sure to do both.  Reminded patient that all providers will be coming to the clinic to see them HERE and if they had any questions to not hesitate to reach back out to myself or their navigators.

## 2024-01-28 ENCOUNTER — Ambulatory Visit
Admission: RE | Admit: 2024-01-28 | Discharge: 2024-01-28 | Disposition: A | Discharge status: Home / Self Care | Source: Ambulatory Visit | Attending: Obstetrics and Gynecology | Admitting: Obstetrics and Gynecology

## 2024-01-28 ENCOUNTER — Other Ambulatory Visit: Payer: Self-pay | Admitting: Obstetrics and Gynecology

## 2024-01-28 DIAGNOSIS — R921 Mammographic calcification found on diagnostic imaging of breast: Secondary | ICD-10-CM

## 2024-01-28 HISTORY — PX: BREAST BIOPSY: SHX20

## 2024-01-29 LAB — SURGICAL PATHOLOGY

## 2024-02-01 ENCOUNTER — Encounter: Payer: Self-pay | Admitting: *Deleted

## 2024-02-01 DIAGNOSIS — C50412 Malignant neoplasm of upper-outer quadrant of left female breast: Secondary | ICD-10-CM | POA: Insufficient documentation

## 2024-02-03 ENCOUNTER — Inpatient Hospital Stay: Admitting: Licensed Clinical Social Worker

## 2024-02-03 ENCOUNTER — Encounter: Payer: Self-pay | Admitting: Physical Therapy

## 2024-02-03 ENCOUNTER — Encounter: Payer: Self-pay | Admitting: Genetic Counselor

## 2024-02-03 ENCOUNTER — Inpatient Hospital Stay: Attending: Hematology and Oncology | Admitting: Hematology and Oncology

## 2024-02-03 ENCOUNTER — Ambulatory Visit
Admission: RE | Admit: 2024-02-03 | Discharge: 2024-02-03 | Disposition: A | Discharge status: Home / Self Care | Source: Ambulatory Visit | Attending: Radiation Oncology | Admitting: Radiation Oncology

## 2024-02-03 ENCOUNTER — Inpatient Hospital Stay

## 2024-02-03 ENCOUNTER — Inpatient Hospital Stay: Admitting: Genetic Counselor

## 2024-02-03 ENCOUNTER — Other Ambulatory Visit: Payer: Self-pay

## 2024-02-03 ENCOUNTER — Encounter: Payer: Self-pay | Admitting: Hematology and Oncology

## 2024-02-03 ENCOUNTER — Ambulatory Visit: Payer: Self-pay | Admitting: Surgery

## 2024-02-03 ENCOUNTER — Ambulatory Visit: Attending: Surgery | Admitting: Physical Therapy

## 2024-02-03 VITALS — BP 118/80 | HR 72 | Temp 97.6°F | Resp 18 | Wt 155.4 lb

## 2024-02-03 DIAGNOSIS — Z800 Family history of malignant neoplasm of digestive organs: Secondary | ICD-10-CM | POA: Diagnosis not present

## 2024-02-03 DIAGNOSIS — Z170 Estrogen receptor positive status [ER+]: Secondary | ICD-10-CM

## 2024-02-03 DIAGNOSIS — C50912 Malignant neoplasm of unspecified site of left female breast: Secondary | ICD-10-CM

## 2024-02-03 DIAGNOSIS — R293 Abnormal posture: Secondary | ICD-10-CM | POA: Diagnosis present

## 2024-02-03 DIAGNOSIS — C50412 Malignant neoplasm of upper-outer quadrant of left female breast: Secondary | ICD-10-CM | POA: Insufficient documentation

## 2024-02-03 LAB — CBC WITH DIFFERENTIAL (CANCER CENTER ONLY)
Abs Immature Granulocytes: 0.02 10*3/uL (ref 0.00–0.07)
Basophils Absolute: 0 10*3/uL (ref 0.0–0.1)
Basophils Relative: 1 %
Eosinophils Absolute: 0.2 10*3/uL (ref 0.0–0.5)
Eosinophils Relative: 3 %
HCT: 40.4 % (ref 36.0–46.0)
Hemoglobin: 13.8 g/dL (ref 12.0–15.0)
Immature Granulocytes: 0 %
Lymphocytes Relative: 23 %
Lymphs Abs: 2 10*3/uL (ref 0.7–4.0)
MCH: 30 pg (ref 26.0–34.0)
MCHC: 34.2 g/dL (ref 30.0–36.0)
MCV: 87.8 fL (ref 80.0–100.0)
Monocytes Absolute: 0.5 10*3/uL (ref 0.1–1.0)
Monocytes Relative: 6 %
Neutro Abs: 5.8 10*3/uL (ref 1.7–7.7)
Neutrophils Relative %: 67 %
Platelet Count: 234 10*3/uL (ref 150–400)
RBC: 4.6 MIL/uL (ref 3.87–5.11)
RDW: 13.8 % (ref 11.5–15.5)
WBC Count: 8.6 10*3/uL (ref 4.0–10.5)
nRBC: 0 % (ref 0.0–0.2)

## 2024-02-03 LAB — CMP (CANCER CENTER ONLY)
ALT: 18 U/L (ref 0–44)
AST: 19 U/L (ref 15–41)
Albumin: 4.8 g/dL (ref 3.5–5.0)
Alkaline Phosphatase: 56 U/L (ref 38–126)
Anion gap: 7 (ref 5–15)
BUN: 15 mg/dL (ref 6–20)
CO2: 28 mmol/L (ref 22–32)
Calcium: 10 mg/dL (ref 8.9–10.3)
Chloride: 104 mmol/L (ref 98–111)
Creatinine: 0.83 mg/dL (ref 0.44–1.00)
GFR, Estimated: >60 mL/min (ref >60)
Glucose, Bld: 97 mg/dL (ref 70–99)
Potassium: 3.9 mmol/L (ref 3.5–5.1)
Sodium: 139 mmol/L (ref 135–145)
Total Bilirubin: 0.7 mg/dL (ref 0.0–1.2)
Total Protein: 8.1 g/dL (ref 6.5–8.1)

## 2024-02-03 LAB — GENETIC SCREENING ORDER

## 2024-02-03 LAB — RESEARCH LABS

## 2024-02-03 NOTE — Research (Signed)
 [employer] 2021-05 - Specimen Collection Study to Evaluate Biomarkers in Subjects with Cancer   This Nurse has reviewed this patient's inclusion and exclusion criteria as a second review and confirms Jacqueline Mayer is eligible for study participation.  Patient may continue with enrollment.  Aurora Lees, BSN, RN, [employer] Clinical Research Nurse II [phone] 02/03/2024 12:00 PM

## 2024-02-03 NOTE — Progress Notes (Signed)
 REFERRING PROVIDER: Cameron Cea, MD  PRIMARY PROVIDER:  Imelda Man, MD  PRIMARY REASON FOR VISIT:  1. Malignant neoplasm of upper-outer quadrant of left breast in female, estrogen receptor positive (HCC)   2. Family history of colon cancer    HISTORY OF PRESENT ILLNESS:   Jacqueline Mayer, a 58 y.o. female, was seen for a [place] cancer genetics consultation during the breast multidisciplinary clinic at the request of Dr. Lee Public due to a personal and family history of cancer.  Jacqueline Mayer presents to clinic today to discuss the possibility of a hereditary predisposition to cancer, to discuss genetic testing, and to further clarify her future cancer risks, as well as potential cancer risks for family members.   In May 2025, at the age of 37, Jacqueline Mayer was diagnosed with invasive ductal carcinoma of the left breast (ER/PR positive, HER2 negative).   CANCER HISTORY:  Oncology History  Malignant neoplasm of upper-outer quadrant of left breast in female, estrogen receptor positive (HCC)  01/28/2024 Initial Diagnosis   Screening mammogram detected left breast mass at 3 o'clock position 0.8 cm: Biopsy: Grade 2 IDC ER 100%, PR 100%, Ki67 2%, HER2 equivocal by IHC FISH negative, posterior left breast calcifications 1.7 cm: Biopsy: Benign concordant, axilla negative   02/03/2024 Cancer Staging   Staging form: Breast, AJCC 8th Edition - Clinical: Stage IA (cT1b, cN0, cM0, G2, ER+, PR+, HER2-) - Signed by Cameron Cea, MD on 02/03/2024 Stage prefix: Initial diagnosis Histologic grading system: 3 grade system    Past Medical History:  Diagnosis Date   Breast cancer (HCC)    Hyperlipidemia    Hypertension    Thyroid  disease     Past Surgical History:  Procedure Laterality Date   BREAST BIOPSY Left 01/22/2024   US  LT BREAST BX W LOC DEV 1ST LESION IMG BX SPEC US  GUIDE 01/22/2024 [facility]   BREAST BIOPSY Left 01/28/2024   MM LT BREAST BX W LOC DEV 1ST LESION IMAGE BX SPEC STEREO GUIDE  01/28/2024 [facility]   CESAREAN SECTION     COLONOSCOPY  03/29/2021   2003   TONSILECTOMY, ADENOIDECTOMY, BILATERAL MYRINGOTOMY AND TUBES      Social History   Socioeconomic History   Marital status: Married    Spouse name: Not on file   Number of children: 1   Years of education: Not on file   Highest education level: Not on file  Occupational History   Occupation: Production designer, theatre/television/film at [employer]  Tobacco Use   Smoking status: Never   Smokeless tobacco: Never  Vaping Use   Vaping status: Never Used  Substance and Sexual Activity   Alcohol use: Yes    Alcohol/week: 3.0 standard drinks of alcohol    Types: 3 Glasses of wine per week   Drug use: Never   Sexual activity: Yes  Other Topics Concern   Not on file  Social History Narrative   Not on file   Social Drivers of Health   Financial Resource Strain: Not on file  Food Insecurity: No Food Insecurity (02/03/2024)   Hunger Vital Sign    Worried About Running Out of Food in the Last Year: Never true    Ran Out of Food in the Last Year: Never true  Transportation Needs: No Transportation Needs (02/03/2024)   PRAPARE - Administrator, Civil Service (Medical): No    Lack of Transportation (Non-Medical): No  Physical Activity: Not on file  Stress: Not on file  Social  Connections: Unknown (01/09/2022)   Received from [facility]   Social Network    Social Network: Not on file     FAMILY HISTORY:  We obtained a detailed, 4-generation family history.  Significant diagnoses are listed below: Family History  Problem Relation Age of Onset   Colon polyps Mother    Heart attack Mother    Diabetes Mother    Colon cancer Mother 79       dx. liver cancer at the same time, unsure which was the primary   Cervical cancer Mother 11   Colon polyps Father    Diabetes Father    Cervical cancer Maternal Aunt 34   Cervical cancer Maternal Grandmother 61     Jacqueline Mayer is unaware of previous family history of  genetic testing for hereditary cancer risks. There is no reported Ashkenazi Jewish ancestry.  GENETIC COUNSELING ASSESSMENT: Jacqueline Mayer is a 58 y.o. female with a personal and family history of cancer which is somewhat suggestive of a hereditary cancer syndrome and predisposition to cancer given her young age at diagnosis. We, therefore, discussed and recommended the following at today's visit.   DISCUSSION: We discussed that 5 - 10% of cancer is hereditary, with most cases of hereditary breast cancer associated with mutations in BRCA1/2.  There are other genes that can be associated with hereditary breast cancer syndromes. Type of cancer risk and level of risk are gene-specific. We discussed that testing is beneficial for several reasons including knowing how to follow individuals after completing their treatment, identifying whether potential treatment options would be beneficial, and understanding if other family members could be at risk for cancer and allowing them to undergo genetic testing.   We reviewed the characteristics, features and inheritance patterns of hereditary cancer syndromes. We also discussed genetic testing, including the appropriate family members to test, the process of testing, insurance coverage and turn-around-time for results. We discussed the implications of a negative, positive and/or variant of uncertain significant result. In order to get genetic test results in a timely manner so that Jacqueline Mayer can use these genetic test results for surgical decisions, we recommended Jacqueline Mayer pursue genetic testing for the BRCAplus. Once complete, we recommend Jacqueline Mayer pursue reflex genetic testing to a more comprehensive gene panel.   Jacqueline Mayer  was offered a common hereditary cancer panel (40 genes) and an expanded pan-cancer panel (77 genes). Jacqueline Mayer was informed of the benefits and limitations of each panel, including that expanded pan-cancer panels contain genes that do not have clear  management guidelines at this point in time.  We also discussed that as the number of genes included on a panel increases, the chances of variants of uncertain significance increases.  After considering the benefits and limitations of each gene panel, Jacqueline Mayer elected to have [facility] CancerNext-Expanded Panel.  The CancerNext-Expanded gene panel offered by [facility] and includes sequencing, rearrangement, and RNA analysis for the following 77 genes: AIP, ALK, APC, ATM, AXIN2, BAP1, BARD1, BMPR1A, BRCA1, BRCA2, BRIP1, CDC73, CDH1, CDK4, CDKN1B, CDKN2A, CEBPA, CHEK2, CTNNA1, DDX41, DICER1, ETV6, FH, FLCN, GATA2, LZTR1, MAX, MBD4, MEN1, MET, MLH1, MSH2, MSH3, MSH6, MUTYH, NF1, NF2, NTHL1, PALB2, PHOX2B, PMS2, POT1, PRKAR1A, PTCH1, PTEN, RAD51C, RAD51D, RB1, RET, RPS20, RUNX1, SDHA, SDHAF2, SDHB, SDHC, SDHD, SMAD4, SMARCA4, SMARCB1, SMARCE1, STK11, SUFU, TMEM127, TP53, TSC1, TSC2, VHL, and WT1 (sequencing and deletion/duplication); EGFR, HOXB13, KIT, MITF, PDGFRA, POLD1, and POLE (sequencing only); EPCAM and GREM1 (deletion/duplication only).    Based on Ms.  Mayer's personal and family history of cancer, she does not meet her insurance criteria for genetic testing. However, we are currently recommending genetic testing to all women diagnosed with breast cancer under age 49. She may have an out of pocket cost. We discussed that if her out of pocket cost for testing is over $100, the laboratory should contact them to discuss self-pay prices, patient pay assistance programs, if applicable, and other billing options.   PLAN: After considering the risks, benefits, and limitations, Jacqueline Mayer provided informed consent to pursue genetic testing and the blood sample was sent to [facility] for analysis of the CancerNext-Expanded Panel. Results should be available within approximately 1-2 weeks' time, at which point they will be disclosed by telephone to Jacqueline Mayer, as will any additional recommendations warranted by  these results. Jacqueline Mayer will receive a summary of her genetic counseling visit and a copy of her results once available. This information will also be available in Epic.   Jacqueline Mayer questions were answered to her satisfaction today. Our contact information was provided should additional questions or concerns arise. Thank you for the referral and allowing us  to share in the care of your patient.   Sabrie Moritz, MS, [facility] Genetic Counselor [email or web address] (P) [phone]  30 minutes were spent on the date of the encounter in service to the patient including preparation, face-to-face consultation, documentation and care coordination.  The patient brought her husband. Drs. Gudena and/or Maryalice Smaller were available to discuss this case as needed.  _______________________________________________________________________ For Office Staff:  Number of people involved in session: 2 Was an Intern/ student involved with case: no

## 2024-02-03 NOTE — Progress Notes (Signed)
 [facility] Clinical Social Work  Initial Assessment   Jacqueline Mayer is a 58 y.o. year old female accompanied by spouse, Larinda Plover. Clinical Social Work was referred by [facility] for assessment of psychosocial needs.   SDOH (Social Determinants of Health) assessments performed: Yes SDOH Interventions    Flowsheet Row Clinical Support from 02/03/2024 in [facility] - A Dept Of [place]  SDOH Interventions   Food Insecurity Interventions Intervention Not Indicated  Housing Interventions Intervention Not Indicated  Transportation Interventions Intervention Not Indicated  Utilities Interventions Intervention Not Indicated       SDOH Screenings   Food Insecurity: No Food Insecurity (02/03/2024)  Housing: Low Risk  (02/03/2024)  Transportation Needs: No Transportation Needs (02/03/2024)  Utilities: Not At Risk (02/03/2024)  Depression (PHQ2-9): Low Risk  (02/03/2024)  Social Connections: Unknown (01/09/2022)   Received from [facility]  Tobacco Use: Low Risk  (02/03/2024)     Distress Screen completed: No     No data to display            Family/Social Information:  Housing Arrangement: patient lives with her husband. They rent at [place] at [place]. Adult daughter and 3yo granddaughter live nearby Family members/support persons in your life? Family and Friends Transportation concerns: no  Employment: Retired Museum/gallery curator.  Income source: retirement & husband's employment Financial concerns: No Type of concern: None Food access concerns: no Religious or spiritual practice: Not known Advanced directives: Yes-not on file Services Currently in place:  [employer]  Coping/ Adjustment to diagnosis: Patient understands treatment plan and what happens next? yes, husband had the same diagnosis and treatment 18 months ago, so pt feels well informed and knows what to expect.  Concerns about diagnosis and/or treatment: I'm not especially worried about  anything Patient enjoys time with family/ friends, meals together with others Current coping skills/ strengths: Ability for insight , Active sense of humor , Capable of independent living , Communication skills , Motivation for treatment/growth , and Supportive family/friends     SUMMARY: Current SDOH Barriers:  No major barriers identified today  Clinical Social Work Clinical Goal(s):  No clinical social work goals at this time  Interventions: Discussed common feeling and emotions when being diagnosed with cancer, and the importance of support during treatment Informed patient of the support team roles and support services at [facility] Provided CSW contact information and encouraged patient to call with any questions or concerns   Follow Up Plan: Patient will contact CSW with any support or resource needs Patient verbalizes understanding of plan: Yes    Lemya Greenwell E Magdaline Zollars, LCSW Clinical Social Worker [facility]

## 2024-02-03 NOTE — Progress Notes (Cosign Needed Addendum)
 Radiation Oncology         [phone] ________________________________  Name: Jacqueline Mayer        MRN: [id number]  Date of Service: 02/03/2024 DOB: May 19, 1966  UX:LKGMW, Marlou Sims, MD  Sim Dryer, MD     REFERRING PHYSICIAN: Sim Dryer, MD  DIAGNOSIS: The encounter diagnosis was Malignant neoplasm of upper-outer quadrant of left breast in female, estrogen receptor positive (HCC).   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 female seen in the multidisciplinary breast clinic for a new diagnosis of left breast cancer. The patient was noted to have asymmetry on screening mammogram. She proceeded with diagnostic mammogram and ultrasound on 01/21/2024 that showed a 0.8 cm mass at the 3:00 position, and a 3 mm group of microcalcifications immediately anterior to the mass.  No abnormalities in the left axilla were noted. Accordingly, patient underwent a biopsies of the areas of concern. Pathology from the mass in the 3:00 position revealed grade 2 invasive ductal carcinoma that was ER and PR positive and HER2 negative with a Ki-67 2%; pathology from the area of calcifications was negative for atypia or malignancy.  She is seen today to discuss treatment recommendations of her cancer.      PREVIOUS RADIATION THERAPY: Yes  Radioactive thyroid  ablation in ~2007   PAST MEDICAL HISTORY:  Past Medical History:  Diagnosis Date   Breast cancer (HCC)    Hyperlipidemia    Hypertension    Thyroid  disease        PAST SURGICAL HISTORY: Past Surgical History:  Procedure Laterality Date   BREAST BIOPSY Left 01/22/2024   US  LT BREAST BX W LOC DEV 1ST LESION IMG BX SPEC US  GUIDE 01/22/2024 [facility]   BREAST BIOPSY Left 01/28/2024   MM LT BREAST BX W LOC DEV 1ST LESION IMAGE BX SPEC STEREO GUIDE 01/28/2024 [facility]   CESAREAN SECTION     COLONOSCOPY   03/29/2021   2003   TONSILECTOMY, ADENOIDECTOMY, BILATERAL MYRINGOTOMY AND TUBES       FAMILY HISTORY:  Family History  Problem Relation Age of Onset   Colon polyps Mother    Heart attack Mother    Diabetes Mother    Colon polyps Father    Diabetes Father    Esophageal cancer Neg Hx    Rectal cancer Neg Hx    Stomach cancer Neg Hx    Colon cancer Neg Hx      SOCIAL HISTORY:  reports that she has never smoked. She has never used smokeless tobacco. She reports current alcohol use of about 3.0 standard drinks of alcohol per week. She reports that she does not use drugs.   ALLERGIES: Sulfa antibiotics   MEDICATIONS:  Current Outpatient Medications  Medication Sig Dispense Refill   ALPRAZolam (XANAX) 0.25 MG tablet Take 0.25 mg by mouth daily as needed. (Patient not taking: Reported on 02/03/2024)     aspirin  EC 81 MG tablet Take 1 tablet (81 mg total) by mouth daily. Swallow whole. 90 tablet 3   atorvastatin (LIPITOR) 20 MG tablet Take 1 tablet by mouth daily.     bisoprolol-hydrochlorothiazide (ZIAC) 10-6.25 MG tablet Take 1 tablet by mouth daily.     EPINEPHrine  0.3 mg/0.3 mL IJ SOAJ injection Inject 0.3 mg into the muscle as needed for anaphylaxis. 1 each 1   ezetimibe (ZETIA) 10 MG tablet Take 1 tablet by  mouth daily.     levothyroxine (SYNTHROID) 88 MCG tablet Take 88 mcg by mouth every morning.     tirzepatide (MOUNJARO) 7.5 MG/0.5ML Pen Inject 7.5 mg into the skin once a week.     traZODone (DESYREL) 50 MG tablet Take 50 mg by mouth at bedtime.     Current Facility-Administered Medications  Medication Dose Route Frequency Provider Last Rate Last Admin   0.9 %  sodium chloride  infusion  500 mL Intravenous Once Beavers, Kimberly, MD         REVIEW OF SYSTEMS: On review of systems, the patient reports that she is doing well overall. No breast specific complaints are verbalized.        PHYSICAL EXAM:  Wt Readings from Last 3 Encounters:  04/07/21 160 lb (72.6 kg)   03/29/21 160 lb (72.6 kg)  03/15/21 160 lb (72.6 kg)   Temp Readings from Last 3 Encounters:  04/07/21 98.3 F (36.8 C) (Oral)  03/29/21 98.4 F (36.9 C) (Temporal)  06/08/15 98.4 F (36.9 C) (Oral)   BP Readings from Last 3 Encounters:  04/07/21 126/86  03/29/21 136/82  01/02/21 118/88   Pulse Readings from Last 3 Encounters:  04/07/21 100  03/29/21 80  01/02/21 74    In general this is a well appearing female in no acute distress. She's alert and oriented x4 and appropriate throughout the examination. Cardiopulmonary assessment is negative for acute distress and she exhibits normal effort. Bilateral breast exam is deferred.    ECOG = 0  0 - Asymptomatic (Fully active, able to carry on all predisease activities without restriction)  1 - Symptomatic but completely ambulatory (Restricted in physically strenuous activity but ambulatory and able to carry out work of a light or sedentary nature. For example, light housework, office work)  2 - Symptomatic, <50% in bed during the day (Ambulatory and capable of all self care but unable to carry out any work activities. Up and about more than 50% of waking hours)  3 - Symptomatic, >50% in bed, but not bedbound (Capable of only limited self-care, confined to bed or chair 50% or more of waking hours)  4 - Bedbound (Completely disabled. Cannot carry on any self-care. Totally confined to bed or chair)  5 - Death   Aurea Blossom MM, Creech RH, Tormey DC, et al. [phone]). "Toxicity and response criteria of the [facility]". Am. Hillard Lowes. Oncol. 5 (6): 649-55    LABORATORY DATA:  No results found for: "WBC", "HGB", "HCT", "MCV", "PLT" No results found for: "NA", "K", "CL", "CO2" No results found for: "ALT", "AST", "GGT", "ALKPHOS", "BILITOT"    RADIOGRAPHY: MM LT BREAST BX W LOC DEV 1ST LESION IMAGE BX SPEC STEREO GUIDE Addendum Date: 02/02/2024 ADDENDUM REPORT: 02/02/2024 08:41 ADDENDUM: Pathology revealed: BENIGN  BREAST TISSUE WITH STROMAL FIBROSIS, USUAL DUCTAL HYPERPLASIA AND ASSOCIATED CALCIFICATIONS of the LEFT breast, outer at posterior depth, (X clip). This was found to be concordant by Dr. Marvene Slipper. Pathology results were discussed with the patient by telephone. The patient reported doing well after the biopsy with tenderness and bleeding at the site. Post biopsy instructions and care were reviewed and questions were answered. The patient was encouraged to call [facility] for any additional concerns. My direct phone number was provided. The patient has a recent diagnosis of LEFT breast cancer and was referred to [facility] on February 03, 2024. Pathology results reported  by Kraig Peru, RN on 01/29/2024. Electronically Signed   By: Rinda Cheers M.D.   On: 02/02/2024 08:41   Addendum Date: 01/28/2024 ADDENDUM REPORT: 01/28/2024 09:23 ADDENDUM: The Exam section should also include: DIAGNOSTIC LEFT MAMMOGRAM POST STEREOTACTIC BIOPSY Electronically Signed   By: Rinda Cheers M.D.   On: 01/28/2024 09:23   Result Date: 02/02/2024 CLINICAL DATA:  58 year old with recent biopsy-proven grade 2 invasive ductal carcinoma involving the outer LEFT breast at posterior depth. She has an indeterminate 1.7 cm group of calcifications in the outer breast located approximately 2 cm medial to the biopsy-proven malignancy. EXAM: LEFT BREAST STEREOTACTIC CORE NEEDLE BIOPSY COMPARISON:  Previous exam(s). FINDINGS: The patient and I discussed the procedure of stereotactic-guided biopsy including benefits and alternatives. We discussed the high likelihood of a successful procedure. We discussed the risks of the procedure including infection, bleeding, tissue injury, clip migration, and inadequate sampling. Informed written consent was given. The usual time out protocol was performed immediately prior to the procedure. Lesion  quadrant: Outer breast, localizing to the slight LOWER OUTER QUADRANT. Using sterile technique with chlorhexidine as skin antisepsis, 1% lidocaine and% lidocaine with epinephrine  as local anesthetic, under stereotactic tomosynthesis guidance, a 9 gauge Brevera vacuum assisted device was used to perform core needle biopsy of calcifications in the outer breast at posterior depth using a lateral approach. Specimen radiograph was performed showing calcifications in a at least 2 of the 7 core samples, with the greatest concentration of calcifications in specimen F. Specimens with calcifications are identified for pathology. At the conclusion of the procedure, an X shaped tissue marker clip was deployed into the biopsy cavity. The patient tolerated the procedure well without apparent immediate complications. Follow-up 2D and 3D full field CC mediolateral mammographic images were obtained to confirm clip placement. The X shaped tissue marking clip is appropriately position at the site of the biopsied calcifications outer breast at posterior depth, localizing to the LOWER OUTER QUADRANT. There are scattered residual calcifications at the biopsy site. The X shaped tissue marking clip is approximately 3 cm anteromedial to the heart shaped tissue marking clip placed at the time of the prior ultrasound core needle biopsy. Expected post biopsy changes are present without evidence of hematoma. ACR BREAST DENSITY: ACR Breast Density Category c: The breasts are heterogeneously dense, which may obscure small masses. IMPRESSION: 1. Stereotactic tomosynthesis guided core needle biopsy of an indeterminate group of calcifications in the outer LEFT breast at posterior depth located approximately 2 cm medial to a biopsy-proven malignancy. 2. Appropriate positioning the X shaped tissue marking clip at the site of the biopsied calcifications in the outer breast at posterior depth, localizing to the LOWER OUTER QUADRANT. 3. The X shaped  tissue marking clip and the heart shaped tissue marking clip (placed at the time of ultrasound core needle biopsy) are separated by approximately 3 cm. If the calcifications return as malignant, then a bracketed localization prior to lumpectomy might be considered. Electronically Signed: By: Rinda Cheers M.D. On: 01/28/2024 08:34   MM CLIP PLACEMENT LEFT Addendum Date: 02/02/2024 ADDENDUM REPORT: 02/02/2024 08:41 ADDENDUM: Pathology revealed: BENIGN BREAST TISSUE WITH STROMAL FIBROSIS, USUAL DUCTAL HYPERPLASIA AND ASSOCIATED CALCIFICATIONS of the LEFT breast, outer at posterior depth, (X clip). This was found to be concordant by Dr. Marvene Slipper. Pathology results were discussed with the patient by telephone. The patient reported doing well after the biopsy with tenderness and bleeding at the site. Post biopsy instructions and care were reviewed and questions were answered.  The patient was encouraged to call [facility] for any additional concerns. My direct phone number was provided. The patient has a recent diagnosis of LEFT breast cancer and was referred to [facility] on February 03, 2024. Pathology results reported by Kraig Peru, RN on 01/29/2024. Electronically Signed   By: Rinda Cheers M.D.   On: 02/02/2024 08:41   Addendum Date: 01/28/2024 ADDENDUM REPORT: 01/28/2024 09:23 ADDENDUM: The Exam section should also include: DIAGNOSTIC LEFT MAMMOGRAM POST STEREOTACTIC BIOPSY Electronically Signed   By: Rinda Cheers M.D.   On: 01/28/2024 09:23   Result Date: 02/02/2024 CLINICAL DATA:  58 year old with recent biopsy-proven grade 2 invasive ductal carcinoma involving the outer LEFT breast at posterior depth. She has an indeterminate 1.7 cm group of calcifications in the outer breast located approximately 2 cm medial to the biopsy-proven malignancy. EXAM: LEFT BREAST STEREOTACTIC CORE NEEDLE BIOPSY  COMPARISON:  Previous exam(s). FINDINGS: The patient and I discussed the procedure of stereotactic-guided biopsy including benefits and alternatives. We discussed the high likelihood of a successful procedure. We discussed the risks of the procedure including infection, bleeding, tissue injury, clip migration, and inadequate sampling. Informed written consent was given. The usual time out protocol was performed immediately prior to the procedure. Lesion quadrant: Outer breast, localizing to the slight LOWER OUTER QUADRANT. Using sterile technique with chlorhexidine as skin antisepsis, 1% lidocaine and% lidocaine with epinephrine  as local anesthetic, under stereotactic tomosynthesis guidance, a 9 gauge Brevera vacuum assisted device was used to perform core needle biopsy of calcifications in the outer breast at posterior depth using a lateral approach. Specimen radiograph was performed showing calcifications in a at least 2 of the 7 core samples, with the greatest concentration of calcifications in specimen F. Specimens with calcifications are identified for pathology. At the conclusion of the procedure, an X shaped tissue marker clip was deployed into the biopsy cavity. The patient tolerated the procedure well without apparent immediate complications. Follow-up 2D and 3D full field CC mediolateral mammographic images were obtained to confirm clip placement. The X shaped tissue marking clip is appropriately position at the site of the biopsied calcifications outer breast at posterior depth, localizing to the LOWER OUTER QUADRANT. There are scattered residual calcifications at the biopsy site. The X shaped tissue marking clip is approximately 3 cm anteromedial to the heart shaped tissue marking clip placed at the time of the prior ultrasound core needle biopsy. Expected post biopsy changes are present without evidence of hematoma. ACR BREAST DENSITY: ACR Breast Density Category c: The breasts are heterogeneously  dense, which may obscure small masses. IMPRESSION: 1. Stereotactic tomosynthesis guided core needle biopsy of an indeterminate group of calcifications in the outer LEFT breast at posterior depth located approximately 2 cm medial to a biopsy-proven malignancy. 2. Appropriate positioning the X shaped tissue marking clip at the site of the biopsied calcifications in the outer breast at posterior depth, localizing to the LOWER OUTER QUADRANT. 3. The X shaped tissue marking clip and the heart shaped tissue marking clip (placed at the time of ultrasound core needle biopsy) are separated by approximately 3 cm. If the calcifications return as malignant, then a bracketed localization prior to lumpectomy might be considered. Electronically Signed: By: Rinda Cheers M.D. On: 01/28/2024 08:34   US  LT BREAST BX W LOC DEV 1ST LESION IMG BX SPEC US  GUIDE Addendum Date: 01/26/2024 ADDENDUM REPORT: 01/26/2024 13:16 ADDENDUM: Pathology revealed GRADE II  INVASIVE MODERATELY DIFFERENTIATED DUCTAL ADENOCARCINOMA of the LEFT breast, 3 o'clock, 7 CMFN, (heart clip). This was found to be concordant by Dr. Luann Rundle Mir. Pathology results were discussed with the patient by telephone. The patient reported doing well after the biopsy with moderate tenderness and bruising at the site. Post biopsy instructions and care were reviewed and questions were answered. The patient was encouraged to call [facility] for any additional concerns. My direct phone number was provided. The patient was referred to [facility] on February 03, 2024. The patient is scheduled for a LEFT breast stereotactic guided biopsy of the 1.7 cm group of microcalcifications over the outer mid to lower breast on Jan 28, 2024. Further recommendations will be guided by the results of this biopsy. Pathology results reported by Kraig Peru, RN on 01/26/2024. Electronically  Signed   By: Elester Grim M.D.   On: 01/26/2024 13:16   Result Date: 01/26/2024 CLINICAL DATA:  58 year old woman with 8 mm spiculated mass of the LEFT breast presents for ultrasound-guided core needle biopsy. EXAM: ULTRASOUND GUIDED LEFT BREAST CORE NEEDLE BIOPSY COMPARISON:  Previous exam(s). PROCEDURE: I met with the patient and we discussed the procedure of ultrasound-guided biopsy, including benefits and alternatives. We discussed the high likelihood of a successful procedure. We discussed the risks of the procedure, including infection, bleeding, tissue injury, clip migration, and inadequate sampling. Informed written consent was given. The usual time-out protocol was performed immediately prior to the procedure. Lesion quadrant: Upper outer quadrant Using sterile technique and 1% Lidocaine as local anesthetic, under direct ultrasound visualization, a 14 gauge spring-loaded device was used to perform biopsy of of the 8 mm spiculated mass at 3 o'clock 7 CMFN using a lateral approach. At the conclusion of the procedure heart shaped tissue marker clip was deployed into the biopsy cavity. Follow up 2 view mammogram was performed and dictated separately. IMPRESSION: Ultrasound guided biopsy of 8 mm LEFT breast mass. No apparent complications. Electronically Signed: By: Elester Grim M.D. On: 01/22/2024 12:44   MM CLIP PLACEMENT LEFT Result Date: 01/22/2024 CLINICAL DATA:  Status post ultrasound-guided biopsy of LEFT outer breast mass EXAM: 3D DIAGNOSTIC LEFT MAMMOGRAM POST ULTRASOUND BIOPSY COMPARISON:  Previous exam(s). ACR Breast Density Category c: The breasts are heterogeneously dense, which may obscure small masses. FINDINGS: 3D Mammographic images were obtained following ultrasound guided biopsy of 8 mm LEFT outer breast mass. The biopsy marking clip is in expected position at the site of biopsy. IMPRESSION: Appropriate positioning of the heart shaped biopsy marking clip at the site of biopsy in the outer  LEFT breast. Final Assessment: Post Procedure Mammograms for Marker Placement Electronically Signed   By: Elester Grim M.D.   On: 01/22/2024 12:46   MM 3D DIAGNOSTIC MAMMOGRAM UNILATERAL LEFT BREAST Result Date: 01/21/2024 CLINICAL DATA:  Recall from screening to evaluate a possible left breast mass. EXAM: DIGITAL DIAGNOSTIC UNILATERAL LEFT MAMMOGRAM WITH TOMOSYNTHESIS AND CAD; ULTRASOUND LEFT BREAST LIMITED TECHNIQUE: Left digital diagnostic mammography and breast tomosynthesis was performed. The images were evaluated with computer-aided detection. ; Targeted ultrasound examination of the left breast was performed. COMPARISON:  Previous exam(s). ACR Breast Density Category c: The breasts are heterogeneously dense, which may obscure small masses. FINDINGS: Additional images of the left breast demonstrate a subcentimeter spiculated mass over the posterior outer midportion of the left breast. There is a 3 mm group of round microcalcifications 5-6 mm immediately anterior to  the mass on both the lateral and CC images. There is an additional loosely associated group of indeterminate microcalcifications over the outer mid to lower left breast spanning approximately 1.7 cm. Targeted ultrasound is performed, showing an oval hypoechoic mass with indistinct borders and shadowing at the 3 o'clock position of the left breast 7 cm from the nipple measuring 7 x 7 x 8 mm corresponding to the mammographic finding. Ultrasound the left axilla is normal. IMPRESSION: 1. Suspicious 8 mm mass over the 3 o'clock position of the left breast. No abnormal left axillary lymph nodes. 2. 3 mm group of indeterminate microcalcifications 5-6 mm immediately anterior to the suspicious mass at the 3 o'clock position. Additional loosely associated indeterminate 1.7 cm group of microcalcifications over the outer mid to lower left breast. RECOMMENDATION: 1. Ultrasound-guided core needle biopsy of the suspicious left breast mass at the 3 o'clock  position. 2. Recommend stereotactic core needle biopsy of the 1.7 cm group of microcalcifications over the outer mid to lower left breast. I have discussed the findings and recommendations with the patient. If applicable, a reminder letter will be sent to the patient regarding the next appointment. BI-RADS CATEGORY  5: Highly suggestive of malignancy. Biopsy scheduling will be facilitated by the ultrasound technologist. Electronically Signed   By: Roda Cirri M.D.   On: 01/21/2024 14:06   US  LIMITED ULTRASOUND INCLUDING AXILLA LEFT BREAST  Result Date: 01/21/2024 CLINICAL DATA:  Recall from screening to evaluate a possible left breast mass. EXAM: DIGITAL DIAGNOSTIC UNILATERAL LEFT MAMMOGRAM WITH TOMOSYNTHESIS AND CAD; ULTRASOUND LEFT BREAST LIMITED TECHNIQUE: Left digital diagnostic mammography and breast tomosynthesis was performed. The images were evaluated with computer-aided detection. ; Targeted ultrasound examination of the left breast was performed. COMPARISON:  Previous exam(s). ACR Breast Density Category c: The breasts are heterogeneously dense, which may obscure small masses. FINDINGS: Additional images of the left breast demonstrate a subcentimeter spiculated mass over the posterior outer midportion of the left breast. There is a 3 mm group of round microcalcifications 5-6 mm immediately anterior to the mass on both the lateral and CC images. There is an additional loosely associated group of indeterminate microcalcifications over the outer mid to lower left breast spanning approximately 1.7 cm. Targeted ultrasound is performed, showing an oval hypoechoic mass with indistinct borders and shadowing at the 3 o'clock position of the left breast 7 cm from the nipple measuring 7 x 7 x 8 mm corresponding to the mammographic finding. Ultrasound the left axilla is normal. IMPRESSION: 1. Suspicious 8 mm mass over the 3 o'clock position of the left breast. No abnormal left axillary lymph nodes. 2. 3 mm group  of indeterminate microcalcifications 5-6 mm immediately anterior to the suspicious mass at the 3 o'clock position. Additional loosely associated indeterminate 1.7 cm group of microcalcifications over the outer mid to lower left breast. RECOMMENDATION: 1. Ultrasound-guided core needle biopsy of the suspicious left breast mass at the 3 o'clock position. 2. Recommend stereotactic core needle biopsy of the 1.7 cm group of microcalcifications over the outer mid to lower left breast. I have discussed the findings and recommendations with the patient. If applicable, a reminder letter will be sent to the patient regarding the next appointment. BI-RADS CATEGORY  5: Highly suggestive of malignancy. Biopsy scheduling will be facilitated by the ultrasound technologist. Electronically Signed   By: Roda Cirri M.D.   On: 01/21/2024 14:06       IMPRESSION/PLAN: 1. Stage IA (cT1b, N0, M0) intermediate grade invasive ductal carcinoma of  the left breast, ER/PR+, HER2- Dr. Jeryl Moris discussed the pathology findings and reviewed the nature of stage I breast cancer. The consensus from the breast conference includes a left lumpectomy, Oncotype testing, radiation, and antiestrogens. Dr. Jeryl Moris recommends external beam radiotherapy to the breast following her lumpectomy to reduce risks of local recurrence. Patient understands that if chemotherapy would precede radiation, if recommended. We discussed the risks, benefits, short, and long term effects of radiotherapy, as well as the curative intent, and the patient is interested in proceeding. Dr. Jeryl Moris discussed the delivery and logistics of radiotherapy and anticipates a course of 4 weeks of radiotherapy to the left breast with deep inspiration breath hold technique. We will see her back a few weeks after surgery to discuss the simulation process and anticipate starting radiotherapy about 4-6 weeks after surgery.   2. Possible genetic predisposition to malignancy. The patient is a  candidate for genetic testing given her personal and family history. She will meet with our geneticist today in clinic.   In a visit lasting 60 minutes, greater than 50% of the time was spent face to face reviewing her case, as well as in preparation of, discussing, and coordinating the patient's care.  The above documentation reflects my direct findings during this shared patient visit. Please see the separate note by Dr. Jeryl Moris on this date for the remainder of the patient's plan of care.    Amiel Kalata, [place]    **Disclaimer: This note was dictated with voice recognition software. Similar sounding words can inadvertently be transcribed and this note may contain transcription errors which may not have been corrected upon publication of note.**

## 2024-02-03 NOTE — Assessment & Plan Note (Signed)
 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 After reviewing the pathology in detail, we proceeded to discuss the different treatment options between surgery, radiation, chemotherapy, antiestrogen therapies.  Recommendations: 1. Breast conserving surgery followed by 2. Oncotype DX testing to determine if chemotherapy would be of any benefit followed by 3. Adjuvant radiation therapy followed by 4. Adjuvant antiestrogen therapy  Oncotype counseling: I discussed Oncotype DX test. I explained to the patient that this is a 21 gene panel to evaluate patient tumors DNA to calculate recurrence score. This would help determine whether patient has high risk or low risk breast cancer. She understands that if her tumor was found to be high risk, she would benefit from systemic chemotherapy. If low risk, no need of chemotherapy.  Return to clinic after surgery to discuss final pathology report and then determine if Oncotype DX testing will need to be sent.

## 2024-02-03 NOTE — Research (Signed)
 [employer] 2021-05 - Specimen Collection Study to Evaluate Biomarkers in Subjects with Cancer    Patient Jacqueline Mayer was identified by Dr. Gudena as a potential candidate for the above listed study.  This Clinical Research Coordinator met with Jacqueline Mayer, [id number], on 02/03/24 in a manner and location that ensures patient privacy to discuss participation in the above listed research study.  Patient is Accompanied by her husband.  A copy of the informed consent document with embedded HIPAA language was provided to the patient.  Patient reads, speaks, and understands [place].    Patient was provided with the business card of this Coordinator and encouraged to contact the research team with any questions.  Patient was provided the option of taking informed consent documents home to review and was encouraged to review at their convenience with their support network, including other care providers. Patient is comfortable with making a decision regarding study participation today.  As outlined in the informed consent form, this Coordinator and Hurtis Maier discussed the purpose of the research study, the investigational nature of the study, study procedures and requirements for study participation, potential risks and benefits of study participation, as well as alternatives to participation. This study is not blinded. The patient understands participation is voluntary and they may withdraw from study participation at any time.  This study does not involve randomization.  This study does not involve an investigational drug or device. This study does not involve a placebo. Patient understands enrollment is pending full eligibility review.   Confidentiality and how the patient's information will be used as part of study participation were discussed.  Patient was informed there is reimbursement provided for their time and effort spent on trial participation.    All questions were answered to  patient's satisfaction.  The informed consent with embedded HIPAA language was reviewed page by page.  The patient's mental and emotional status is appropriate to provide informed consent, and the patient verbalizes an understanding of study participation.  Patient has agreed to participate in the above listed research study and has voluntarily signed the informed consent Version 14 Sep 2020 [Revised 30 Sep 2021] with embedded HIPAA language, version 14 Sep 2020 [Revised 30 Sep 2021] on 02/03/24 at 11:50 AM.  The patient was provided with a copy of the signed informed consent form with embedded HIPAA language for their reference.  No study specific procedures were obtained prior to the signing of the informed consent document.  Approximately 20 minutes were spent with the patient reviewing the informed consent documents.  Patient was not requested to complete a Release of Information form.   Eligibility: Eligibility criteria reviewed with patient. This nurse/coordinator has reviewed this patient's inclusion and exclusion criteria and confirmed patient is eligible for study participation. Eligibility confirmed by treating investigator, who also agrees that patient should proceed with enrollment. Patient will continue with enrollment.  Data Collection: Patient was interviewed to collect the following information.  Medical History:  High Blood Pressure  Yes Coronary Artery Disease No Lupus    No Rheumatoid Arthritis  Yes Diabetes   Yes    If yes, which type?      Prediabetes Lynch Syndrome  No  Is the patient currently taking a magnesium supplement?   No Does the patient have a personal history of cancer (greater than 5 years ago)?  No Does the patient have a family history of cancer in 1st or 2nd degree relatives? Yes If yes, Relationship(s) and Cancer type(s)?  Maternal Grandmother: cervical cancer Mother: cervical cancer, liver cancer and colon cancer Maternal aunt: cervical cancer   Does the  patient have history of alcohol consumption? Yes   If yes, current or former? current Number of years? 36 years (since 58 yo) Drinks per week? 3 drinks  Does the patient have history of cigarette, cigar, pipe, or chewing tobacco use?  No   Blood Collection: Research blood obtained by fresh venipuncture. Patient tolerated well without any adverse events.  Gift Card: $50 gift card given to patient for her participation in this study.    The patient was thanked for her time and participation in the study.   Char Feltman, Ph.D. Clinical Research Coordinator [phone] 02/03/2024 1:18 PM

## 2024-02-03 NOTE — Therapy (Signed)
 OUTPATIENT PHYSICAL THERAPY BREAST CANCER BASELINE EVALUATION   Patient Name: Jacqueline Mayer MRN: [id number] DOB:1966-07-12, 58 y.o., female Today's Date: 02/03/2024  END OF SESSION:  PT End of Session - 02/03/24 0956     Visit Number 1    Number of Visits 2    Date for PT Re-Evaluation 03/30/24    PT Start Time 0856    PT Stop Time 0931    PT Time Calculation (min) 35 min    Activity Tolerance Patient tolerated treatment well    Behavior During Therapy [facility] for tasks assessed/performed             Past Medical History:  Diagnosis Date   Breast cancer (HCC)    Hyperlipidemia    Hypertension    Thyroid  disease    Past Surgical History:  Procedure Laterality Date   BREAST BIOPSY Left 01/22/2024   US  LT BREAST BX W LOC DEV 1ST LESION IMG BX SPEC US  GUIDE 01/22/2024 [facility]   BREAST BIOPSY Left 01/28/2024   MM LT BREAST BX W LOC DEV 1ST LESION IMAGE BX SPEC STEREO GUIDE 01/28/2024 [facility]   CESAREAN SECTION     COLONOSCOPY  03/29/2021   2003   TONSILECTOMY, ADENOIDECTOMY, BILATERAL MYRINGOTOMY AND TUBES     Patient Active Problem List   Diagnosis Date Noted   Malignant neoplasm of upper-outer quadrant of left breast in female, estrogen receptor positive (HCC) 02/01/2024   REFERRING PROVIDER: Dr. Sim Dryer  REFERRING DIAG: Left breast cancer  THERAPY DIAG:  Malignant neoplasm of upper-outer quadrant of left breast in female, estrogen receptor positive (HCC)  Abnormal posture  Rationale for Evaluation and Treatment: Rehabilitation  ONSET DATE: 01/08/2024  SUBJECTIVE:                                                                                                                                                                                           SUBJECTIVE STATEMENT: Patient reports she is here today to be seen by her medical team for her newly diagnosed left breast cancer.   PERTINENT HISTORY:  Patient was diagnosed on 01/08/2024 with  left grade 2 invasive ductal carcinoma breast cancer. It measures 8 mm and is located in the upper outer quadrant. It is ER/PR positive and HER2 negative with a Ki67 of 2%. She has a history of Graves disease treated with oral radiation. She has also been diagnosed with ankylosing spondylosis in 2023.   PATIENT GOALS:   reduce lymphedema risk and learn post op HEP.   PAIN:  Are you having pain? Yes: NPRS scale: 7/10 Pain location: right hip Pain description: sharp  Aggravating factors: getting in a car Relieving factors: unknown  PRECAUTIONS: Active CA   RED FLAGS: None   HAND DOMINANCE: right  WEIGHT BEARING RESTRICTIONS: No  FALLS:  Has patient fallen in last 6 months? 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  COGNITION: Overall cognitive status: Within functional limits for tasks assessed    POSTURE:  Forward head and rounded shoulders posture  UPPER EXTREMITY AROM/PROM:  A/PROM RIGHT   eval   Shoulder extension 47  Shoulder flexion 147  Shoulder abduction 163  Shoulder internal rotation 56  Shoulder external rotation 90    (Blank rows = not tested)  A/PROM LEFT   eval  Shoulder extension 52  Shoulder flexion 141  Shoulder abduction 162  Shoulder internal rotation 51  Shoulder external rotation 88    (Blank rows = not tested)  CERVICAL AROM: All within normal limits  UPPER EXTREMITY STRENGTH: WNL  LYMPHEDEMA ASSESSMENTS (in cm):   LANDMARK RIGHT   eval  10 cm proximal to olecranon process 33  Olecranon process 25.5  10 cm proximal to ulnar styloid process 24.6  Just proximal to ulnar styloid process  16.1  Across hand at thumb web space 18.7  At base of 2nd digit 6.2  (Blank rows = not tested)  LANDMARK LEFT   eval  10 cm proximal to olecranon process 33.1  Olecranon process 25.1  10 cm proximal to ulnar styloid process 23.4  Just proximal to ulnar styloid process 15.7  Across hand at thumb web space 18.2  At base of 2nd digit 6.2  (Blank rows = not tested)  L-DEX LYMPHEDEMA SCREENING:  The patient was assessed using the L-Dex machine today to produce a lymphedema index baseline score. The patient will be reassessed on a regular basis (typically every 3 months) to obtain new L-Dex scores. If the score is > 6.5 points away from his/her baseline score indicating onset of subclinical lymphedema, it will be recommended to wear a compression garment for 4 weeks, 12 hours per day and then be reassessed. If the score continues to be > 6.5 points from baseline at reassessment, we will initiate lymphedema treatment. Assessing in this manner has a 95% rate of preventing clinically significant lymphedema.   L-DEX FLOWSHEETS - 02/03/24 0900       L-DEX LYMPHEDEMA SCREENING   Measurement Type Unilateral    L-DEX MEASUREMENT EXTREMITY Upper Extremity    POSITION  Standing    DOMINANT SIDE Right    At Risk Side Left    BASELINE SCORE (UNILATERAL) 2.9             QUICK DASH SURVEY:  Cindia Crease - 02/03/24 0001     Open a tight or new jar No difficulty    Do heavy household chores (wash walls, wash floors) No difficulty    Carry a shopping bag or briefcase No difficulty    Wash your back No difficulty    Use a knife to cut food No difficulty    Recreational activities in which you take some force or impact through your arm, shoulder, or hand (golf, hammering, tennis) No difficulty    During the past week, to what  extent has your arm, shoulder or hand problem interfered with your normal social activities with family, friends, neighbors, or groups? Not at all    During the past week, to  what extent has your arm, shoulder or hand problem limited your work or other regular daily activities Not at all    Arm, shoulder, or hand pain. None    Tingling (pins and needles) in your arm, shoulder, or hand None    Difficulty Sleeping No difficulty    DASH Score 0 %              PATIENT EDUCATION:  Education details: Time spent educating patient on aspects of self-care to maximize post op recovery. Patient was educated on where and how to get a post op compression bra to use to reduce post op edema. Patient was also educated on the use of SOZO screenings and surveillance principles for early identification of lymphedema onset. She was instructed to use the post op pillow in the axilla for pressure and pain relief. Patient educated on lymphedema risk reduction and post op shoulder/posture HEP. Person educated: Patient Education method: Explanation, Demonstration, Handout Education comprehension: Patient verbalized understanding and returned demonstration  HOME EXERCISE PROGRAM: Patient was instructed today in a home exercise program today for post op shoulder range of motion. These included active assist shoulder flexion in sitting, scapular retraction, wall walking with shoulder abduction, and hands behind head external rotation.  She was encouraged to do these twice a day, holding 3 seconds and repeating 5 times when permitted by her physician.   ASSESSMENT:  CLINICAL IMPRESSION: Patient was diagnosed on 01/08/2024 with left grade 2 invasive ductal carcinoma breast cancer. It measures 8 mm and is located in the upper outer quadrant. It is ER/PR positive and HER2 negative with a Ki67 of 2%. She has a history of Graves disease treated with oral radiation. She has also been diagnosed with ankylosing spondylosis in 2023. Her multidisciplinary medical team met prior to her assessments to determine a recommended treatment plan. She is planning to have a left lumpectomy and sentinel node  biopsy followed by Oncotype testing, radiation, and anti-estrogen therapy. She will benefit from a post op PT reassessment to determine needs and from L-Dex screens every 3 months for 2 years to detect subclinical lymphedema.  Pt will benefit from skilled therapeutic intervention to improve on the following deficits: Decreased knowledge of precautions, impaired UE functional use, pain, decreased ROM, postural dysfunction.   PT treatment/interventions: ADL/self-care home management, pt/family education, therapeutic exercise  REHAB POTENTIAL: Excellent  CLINICAL DECISION MAKING: Stable/uncomplicated  EVALUATION COMPLEXITY: Low   GOALS: Goals reviewed with patient? YES  LONG TERM GOALS: (STG=LTG)    Name Target Date Goal status  1 Pt will be able to verbalize understanding of pertinent lymphedema risk reduction practices relevant to her dx specifically related to skin care.  Baseline:  No knowledge 02/03/2024 Achieved at eval  2 Pt will be able to return demo and/or verbalize understanding of the post op HEP related to regaining shoulder ROM. Baseline:  No knowledge 02/03/2024 Achieved at eval  3 Pt will be able to verbalize understanding of the importance of viewing the post op After Breast CA Class video for further lymphedema risk reduction education and therapeutic exercise.  Baseline:  No knowledge 02/03/2024 Achieved at eval  4 Pt will demo she has regained full shoulder ROM and function post operatively compared to baselines.  Baseline: See objective measurements taken today. 03/30/2024  PLAN:  PT FREQUENCY/DURATION: EVAL and 1 follow up appointment.   PLAN FOR NEXT SESSION: will reassess 3-4 weeks post op to determine needs.   Patient will follow up at outpatient cancer rehab 3-4 weeks following surgery.  If the patient requires physical therapy at that time, a specific plan will be dictated and sent to the referring physician for approval. The patient was educated today on  appropriate basic range of motion exercises to begin post operatively and the importance of viewing the After Breast Cancer class video following surgery.  Patient was educated today on lymphedema risk reduction practices as it pertains to recommendations that will benefit the patient immediately following surgery.  She verbalized good understanding.    Physical Therapy Information for After Breast Cancer Surgery/Treatment:  Lymphedema is a swelling condition that you may be at risk for in your arm if you have lymph nodes removed from the armpit area.  After a sentinel node biopsy, the risk is approximately 5-9% and is higher after an axillary node dissection.  There is treatment available for this condition and it is not life-threatening.  Contact your physician or physical therapist with concerns. You may begin the 4 shoulder/posture exercises (see additional sheet) when permitted by your physician (typically a week after surgery).  If you have drains, you may need to wait until those are removed before beginning range of motion exercises.  A general recommendation is to not lift your arms above shoulder height until drains are removed.  These exercises should be done to your tolerance and gently.  This is not a "no pain/no gain" type of recovery so listen to your body and stretch into the range of motion that you can tolerate, stopping if you have pain.  If you are having immediate reconstruction, ask your plastic surgeon about doing exercises as he or she may want you to wait. We encourage you to view the After Breast Cancer class video following surgery.  You will learn information related to lymphedema risk, prevention and treatment and additional exercises to regain mobility following surgery.   While undergoing any medical procedure or treatment, try to avoid blood pressure being taken or needle sticks from occurring on the arm on the side of cancer.   This recommendation begins after surgery and  continues for the rest of your life.  This may help reduce your risk of getting lymphedema (swelling in your arm). An excellent resource for those seeking information on lymphedema is the National Lymphedema Network's web site. It can be accessed at www.lymphnet.org If you notice swelling in your hand, arm or breast at any time following surgery (even if it is many years from now), please contact your doctor or physical therapist to discuss this.  Lymphedema can be treated at any time but it is easier for you if it is treated early on.  If you feel like your shoulder motion is not returning to normal in a reasonable amount of time, please contact your surgeon or physical therapist.  [facility] [phone]. [address], [address], [place] [address]  [facility] After Breast Cancer Class  After Breast Cancer Class is a specially designed exercise class video to assist you in a safe recover after having breast cancer surgery.  In this video you will learn how to get back to full function whether your drains were just removed or if you had surgery a month ago. The video can be viewed on this page: [email or web address]-meyer.org/ or  on YouTube here: [email or web address].  Class Goals  Understand specific stretches to improve the flexibility of you chest and shoulder. Learn ways to safely strengthen your upper body and improve your posture. Understand the warning signs of infection and why you may be at risk for an arm infection. Learn about Lymphedema and prevention.  ** You do not need to view this video until after surgery.  Drains should be removed to participate in the recommended exercises on the video.  Patient was instructed today in a home exercise program today for post op shoulder range of motion. These included active assist shoulder flexion in sitting, scapular retraction, wall walking  with shoulder abduction, and hands behind head external rotation.  She was encouraged to do these twice a day, holding 3 seconds and repeating 5 times when permitted by her physician.  Rollin Clock, [place] 02/03/24 10:26 AM

## 2024-02-03 NOTE — Progress Notes (Signed)
 [facility] CONSULT NOTE  Patient Care Team: Imelda Man, MD as PCP - General (Internal Medicine) Cameron Cea, MD as Consulting Physician (Hematology and Oncology) Sim Dryer, MD as Consulting Physician (General Surgery) Johna Myers, MD as Consulting Physician (Radiation Oncology) Auther Bo, RN as Oncology Nurse Navigator Alane Hsu, RN as Oncology Nurse Navigator  CHIEF COMPLAINTS/PURPOSE OF CONSULTATION:  Newly diagnosed breast cancer  HISTORY OF PRESENTING ILLNESS: Jacqueline Mayer is a 58 year old postmenopausal lady with a screening mammogram detected left breast mass measuring 0.8 cm at 3 o'clock position.  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  Additional left breast calcification measuring 1.7 cm were biopsied and found to be benign and concordant.  Axilla was negative.  She was discussed this morning in the multidiscipline tumor board and she is here accompanied by her family to discuss her treatment plan.  I reviewed her records extensively and collaborated the history with the patient.  SUMMARY OF ONCOLOGIC HISTORY: Oncology History  Malignant neoplasm of upper-outer quadrant of left breast in female, estrogen receptor positive (HCC)  01/28/2024 Initial Diagnosis   Screening mammogram detected left breast mass at 3 o'clock position 0.8 cm: Biopsy: Grade 2 IDC ER 100%, PR 100%, Ki67 2%, HER2 equivocal by IHC FISH negative, posterior left breast calcifications 1.7 cm: Biopsy: Benign concordant, axilla negative   02/03/2024 Cancer Staging   Staging form: Breast, AJCC 8th Edition - Clinical: Stage IA (cT1b, cN0, cM0, G2, ER+, PR+, HER2-) - Signed by Cameron Cea, MD on 02/03/2024 Stage prefix: Initial diagnosis Histologic grading system: 3 grade system      MEDICAL HISTORY:  Past Medical History:  Diagnosis Date   Breast cancer (HCC)    Hyperlipidemia    Hypertension    Thyroid   disease     SURGICAL HISTORY: Past Surgical History:  Procedure Laterality Date   BREAST BIOPSY Left 01/22/2024   US  LT BREAST BX W LOC DEV 1ST LESION IMG BX SPEC US  GUIDE 01/22/2024 [facility]   BREAST BIOPSY Left 01/28/2024   MM LT BREAST BX W LOC DEV 1ST LESION IMAGE BX SPEC STEREO GUIDE 01/28/2024 [facility]   CESAREAN SECTION     COLONOSCOPY  03/29/2021   2003   TONSILECTOMY, ADENOIDECTOMY, BILATERAL MYRINGOTOMY AND TUBES      SOCIAL HISTORY: Social History   Socioeconomic History   Marital status: Married    Spouse name: Not on file   Number of children: 1   Years of education: Not on file   Highest education level: Not on file  Occupational History   Occupation: Production designer, theatre/television/film at [employer]  Tobacco Use   Smoking status: Never   Smokeless tobacco: Never  Vaping Use   Vaping status: Never Used  Substance and Sexual Activity   Alcohol use: Yes    Alcohol/week: 3.0 standard drinks of alcohol    Types: 3 Glasses of wine per week   Drug use: Never   Sexual activity: Yes  Other Topics Concern   Not on file  Social History Narrative   Not on file   Social Drivers of Health   Financial Resource Strain: Not on file  Food Insecurity: Not on file  Transportation Needs: Not on file  Physical Activity: Not on file  Stress: Not on file  Social Connections: Unknown (01/09/2022)   Received from [facility]   Social Network    Social Network: Not on file  Intimate Partner Violence: Unknown (12/02/2021)   Received from [facility]   HITS    Physically Hurt: Not on file    Insult or Talk Down To: Not on file    Threaten Physical Harm: Not on file    Scream or Curse: Not on file    FAMILY HISTORY: Family History  Problem Relation Age of Onset   Colon polyps Mother    Heart attack Mother    Diabetes Mother    Colon polyps Father    Diabetes Father    Esophageal cancer Neg Hx    Rectal cancer Neg Hx    Stomach cancer Neg Hx    Colon cancer Neg  Hx     ALLERGIES:  is allergic to sulfa antibiotics.  MEDICATIONS:  Current Outpatient Medications  Medication Sig Dispense Refill   aspirin  EC 81 MG tablet Take 1 tablet (81 mg total) by mouth daily. Swallow whole. 90 tablet 3   atorvastatin (LIPITOR) 20 MG tablet Take 1 tablet by mouth daily.     bisoprolol-hydrochlorothiazide (ZIAC) 10-6.25 MG tablet Take 1 tablet by mouth daily.     ezetimibe (ZETIA) 10 MG tablet Take 1 tablet by mouth daily.     levothyroxine (SYNTHROID) 88 MCG tablet Take 88 mcg by mouth every morning.     tirzepatide (MOUNJARO) 7.5 MG/0.5ML Pen Inject 7.5 mg into the skin once a week.     traZODone (DESYREL) 50 MG tablet Take 50 mg by mouth at bedtime.     ALPRAZolam (XANAX) 0.25 MG tablet Take 0.25 mg by mouth daily as needed. (Patient not taking: Reported on 02/03/2024)     EPINEPHrine  0.3 mg/0.3 mL IJ SOAJ injection Inject 0.3 mg into the muscle as needed for anaphylaxis. 1 each 1   Current Facility-Administered Medications  Medication Dose Route Frequency Provider Last Rate Last Admin   0.9 %  sodium chloride  infusion  500 mL Intravenous Once Beavers, Kimberly, MD        REVIEW OF SYSTEMS:   Constitutional: Denies fevers, chills or abnormal night sweats Breast:  Denies any palpable lumps or discharge All other systems were reviewed with the patient and are negative.  PHYSICAL EXAMINATION: ECOG PERFORMANCE STATUS: 0 - Asymptomatic  There were no vitals filed for this visit. There were no vitals filed for this visit.  GENERAL:alert, no distress and comfortable     RADIOGRAPHIC STUDIES: I have personally reviewed the radiological reports and agreed with the findings in the report.  ASSESSMENT AND PLAN:  Malignant neoplasm of upper-outer quadrant of left breast in female, estrogen receptor positive (HCC) 01/28/2024:Screening mammogram detected left breast mass at 3 o'clock position 0.8 cm: Biopsy: Grade 2 IDC ER 100%, PR 100%, Ki67 2%, HER2 equivocal by  IHC FISH negative, posterior left breast calcifications 1.7 cm: Biopsy: Benign concordant, axilla negative  Pathology and radiology counseling:Discussed with the patient, the details of pathology including the type of breast cancer,the clinical staging, the significance of ER, PR and HER-2/neu receptors and the implications for treatment. After reviewing the pathology in detail, we proceeded to discuss the different treatment options between surgery, radiation, chemotherapy, antiestrogen therapies.  Recommendations: 1. Breast conserving surgery followed by 2. Oncotype DX testing to determine if chemotherapy would be of any benefit followed by 3. Adjuvant radiation therapy followed by 4. Adjuvant antiestrogen therapy  Oncotype counseling: I discussed Oncotype DX test. I explained to the patient that this is a 21 gene panel to evaluate patient tumors DNA to calculate recurrence score.  This would help determine whether patient has high risk or low risk breast cancer. She understands that if her tumor was found to be high risk, she would benefit from systemic chemotherapy. If low risk, no need of chemotherapy.  Return to clinic after surgery to discuss final pathology report and then determine if Oncotype DX testing will need to be sent.  Time for clinic visit, charting, coordination of care: 60 minutes All questions were answered. The patient knows to call the clinic with any problems, questions or concerns.    Viinay K Essica Kiker, MD 02/03/24

## 2024-02-03 NOTE — Addendum Note (Signed)
 Encounter addended by: Pearlene Bouchard, PA-C on: 02/03/2024 12:28 PM  Actions taken: Clinical Note Signed

## 2024-02-05 ENCOUNTER — Encounter: Payer: Self-pay | Admitting: *Deleted

## 2024-02-05 ENCOUNTER — Telehealth: Payer: Self-pay | Admitting: *Deleted

## 2024-02-05 NOTE — Telephone Encounter (Signed)
 Spoke to pt concerning [facility] from 02/03/24. Denies questions or concerns regarding dx or treatment care plan. Encourage pt to call with needs. Received verbal understanding.

## 2024-02-05 NOTE — Addendum Note (Signed)
 Encounter addended by: Johna Myers, MD on: 02/05/2024 7:28 AM  Actions taken: Edit attestation on clinical note

## 2024-02-08 ENCOUNTER — Encounter: Payer: Self-pay | Admitting: *Deleted

## 2024-02-08 ENCOUNTER — Other Ambulatory Visit: Payer: Self-pay | Admitting: Surgery

## 2024-02-08 DIAGNOSIS — C50912 Malignant neoplasm of unspecified site of left female breast: Secondary | ICD-10-CM

## 2024-02-15 ENCOUNTER — Telehealth: Payer: Self-pay | Admitting: Genetic Counselor

## 2024-02-15 ENCOUNTER — Encounter: Payer: Self-pay | Admitting: Genetic Counselor

## 2024-02-15 DIAGNOSIS — Z1379 Encounter for other screening for genetic and chromosomal anomalies: Secondary | ICD-10-CM | POA: Insufficient documentation

## 2024-02-15 NOTE — Telephone Encounter (Signed)
 I contacted [name] to discuss her genetic testing results. No pathogenic variants were identified in the first 13 genes analyzed. Of note, we are still waiting on the pan-cancer panel. Detailed clinic note to follow.  The test report has been scanned into EPIC and is located under the Molecular Pathology section of the Results Review tab.  A portion of the result report is included below for reference.   Jacqueline Pemberton, MS, [facility] Genetic Counselor [email or web address] (P) [phone]

## 2024-02-23 ENCOUNTER — Ambulatory Visit: Payer: Self-pay | Admitting: Genetic Counselor

## 2024-02-23 ENCOUNTER — Telehealth: Payer: Self-pay | Admitting: Genetic Counselor

## 2024-02-23 ENCOUNTER — Encounter (HOSPITAL_BASED_OUTPATIENT_CLINIC_OR_DEPARTMENT_OTHER): Payer: Self-pay | Admitting: Surgery

## 2024-02-23 ENCOUNTER — Other Ambulatory Visit: Payer: Self-pay

## 2024-02-23 NOTE — Telephone Encounter (Addendum)
 I contacted Jacqueline Mayer to discuss her genetic testing results. No pathogenic variants were identified in the 77 genes analyzed. Of note, a variant of uncertain significance was identified in the ALK gene. Detailed clinic note to follow.  The test report has been scanned into EPIC and is located under the Molecular Pathology section of the Results Review tab.  A portion of the result report is included below for reference.   Simi Briel, MS, [facility] Genetic Counselor [email or web address] (P) [phone]

## 2024-02-23 NOTE — Progress Notes (Signed)
 HPI:   Jacqueline Mayer was previously seen in the [facility] clinic due to a personal and family history of cancer and concerns regarding a hereditary predisposition to cancer. Please refer to our prior cancer genetics clinic note for more information regarding our discussion, assessment and recommendations, at the time. Jacqueline Mayer recent genetic test results were disclosed to her, as were recommendations warranted by these results. These results and recommendations are discussed in more detail below.  CANCER HISTORY:  Oncology History  Malignant neoplasm of upper-outer quadrant of left breast in female, estrogen receptor positive (HCC)  01/28/2024 Initial Diagnosis   Screening mammogram detected left breast mass at 3 o'clock position 0.8 cm: Biopsy: Grade 2 IDC ER 100%, PR 100%, Ki67 2%, HER2 equivocal by IHC FISH negative, posterior left breast calcifications 1.7 cm: Biopsy: Benign concordant, axilla negative   02/03/2024 Cancer Staging   Staging form: Breast, AJCC 8th Edition - Clinical: Stage IA (cT1b, cN0, cM0, G2, ER+, PR+, HER2-) - Signed by Odean Potts, MD on 02/03/2024 Stage prefix: Initial diagnosis Histologic grading system: 3 grade system    Genetic Testing   [facility] BRCAplus was Negative. Of note, we are still waiting on the pan-cancer panel. Report date is 02/12/2024.   The BRCAplus panel offered by [employer] and includes sequencing and deletion/duplication analysis for the following 13 genes: ATM, BARD1, BRCA1, BRCA2, CDH1, CHEK2, NF1, PALB2, PTEN, RAD51C, RAD51D, STK11, and TP53.      FAMILY HISTORY:  We obtained a detailed, 4-generation family history.  Significant diagnoses are listed below:      Family History  Problem Relation Age of Onset   Colon polyps Mother     Heart attack Mother     Diabetes Mother     Colon cancer Mother 37        dx. liver cancer at the same time, unsure which was the primary   Cervical cancer Mother 54   Colon polyps Father      Diabetes Father     Cervical cancer Maternal Aunt 34   Cervical cancer Maternal Grandmother 56       Ms. Cromie is unaware of previous family history of genetic testing for hereditary cancer risks. There is no reported Ashkenazi Jewish ancestry.  GENETIC TEST RESULTS:  The [facility] CancerNext-Expanded Panel found no pathogenic mutations.   The CancerNext-Expanded gene panel offered by [facility] and includes sequencing and rearrangement for the following 77 genes: AIP, ALK, APC, ATM, AXIN2, BAP1, BARD1, BMPR1A, BRCA1, BRCA2, BRIP1, CDC73, CDH1, CDK4, CDKN1B, CDKN2A, CEBPA, CHEK2, CTNNA1, DDX41, DICER1, ETV6, FH, FLCN, GATA2, LZTR1, MAX, MBD4, MEN1, MET, MLH1, MSH2, MSH3, MSH6, MUTYH, NF1, NF2, NTHL1, PALB2, PHOX2B, PMS2, POT1, PRKAR1A, PTCH1, PTEN, RAD51C, RAD51D, RB1, RET, RPS20, RUNX1, SDHA, SDHAF2, SDHB, SDHC, SDHD, SMAD4, SMARCA4, SMARCB1, SMARCE1, STK11, SUFU, TMEM127, TP53, TSC1, TSC2, VHL, and WT1 (sequencing and deletion/duplication); EGFR, HOXB13, KIT, MITF, PDGFRA, POLD1, and POLE (sequencing only); EPCAM and GREM1 (deletion/duplication only).    The test report has been scanned into EPIC and is located under the Molecular Pathology section of the Results Review tab.  A portion of the result report is included below for reference. Genetic testing reported out on 02/22/2024.       Genetic testing identified a variant of uncertain significance (VUS) in the ALK gene called [id number].[id number].  At this time, it is unknown if this variant is associated with an increased risk for cancer or if it is benign, but most uncertain variants are reclassified  to benign. It should not be used to make medical management decisions. With time, we suspect the laboratory will determine the significance of this variant, if any. If the laboratory reclassifies this variant, we will attempt to contact Ms. Moudy to discuss it further.   Even though a pathogenic variant was not identified, possible explanations for the  cancer in the family may include: There may be no hereditary risk for cancer in the family. The cancers in Ms. Glazier and/or her family may be due to other genetic or environmental factors. There may be a gene mutation in one of these genes that current testing methods cannot detect, but that chance is small. There could be another gene that has not yet been discovered, or that we have not yet tested, that is responsible for the cancer diagnoses in the family.  It is also possible there is a hereditary cause for the cancer in the family that Ms. Register did not inherit.  Therefore, it is important to remain in touch with cancer genetics in the future so that we can continue to offer Ms. Fauteux the most up to date genetic testing.   ADDITIONAL GENETIC TESTING:  We discussed with Ms. Mckendree that her genetic testing was fairly extensive. If there are genes identified to increase cancer risk that can be analyzed in the future, we would be happy to discuss and coordinate this testing at that time. Of note, RNA analysis was not able to be performed on her sample. We offered to collect a new sample and have RNA analysis performed at no charge for Ms. Hessling but she declined.  CANCER SCREENING RECOMMENDATIONS:  Ms. Barraclough's test result is considered negative (normal).  This means that we have not identified a hereditary cause for her personal and family history of cancer at this time.   An individual's cancer risk and medical management are not determined by genetic test results alone. Overall cancer risk assessment incorporates additional factors, including personal medical history, family history, and any available genetic information that may result in a personalized plan for cancer prevention and surveillance. Therefore, it is recommended she continue to follow the cancer management and screening guidelines provided by her oncology and primary healthcare provider.  Based on the reported personal and family history,  specific cancer screenings for Ms. Merle L Dauphinee and her family include:  Colon Cancer Screening: Due to Ms. Mccord's mother's history of colon cancer, she is recommended to repeat colonoscopies every 5 years. More frequent colonoscopies may be recommended if polyps are identified.  RECOMMENDATIONS FOR FAMILY MEMBERS:   Since she did not inherit a mutation in a cancer predisposition gene included on this panel, her daughter could not have inherited a mutation from her in one of these genes. Individuals in this family might be at some increased risk of developing cancer, over the general population risk, due to the family history of cancer. We recommend women in this family have a yearly mammogram beginning at age 47, or 39 years younger than the earliest onset of cancer, an annual clinical breast exam, and perform monthly breast self-exams. We do not recommend familial testing for the ALK variant of uncertain significance (VUS).  FOLLOW-UP:  Cancer genetics is a rapidly advancing field and it is possible that new genetic tests will be appropriate for her and/or her family members in the future. We encouraged her to remain in contact with cancer genetics on an annual basis so we can update her personal and family histories and  let her know of advances in cancer genetics that may benefit this family.   Our contact number was provided. Ms. Rodocker questions were answered to her satisfaction, and she knows she is welcome to call us  at anytime with additional questions or concerns.   Elbridge Magowan, MS, [facility] Genetic Counselor [email or web address] (P) [phone]

## 2024-02-24 ENCOUNTER — Encounter (HOSPITAL_BASED_OUTPATIENT_CLINIC_OR_DEPARTMENT_OTHER)
Admission: RE | Admit: 2024-02-24 | Discharge: 2024-02-24 | Disposition: A | Discharge status: Home / Self Care | Source: Ambulatory Visit | Attending: Surgery | Admitting: Surgery

## 2024-02-24 DIAGNOSIS — I10 Essential (primary) hypertension: Secondary | ICD-10-CM | POA: Insufficient documentation

## 2024-02-24 DIAGNOSIS — Z01810 Encounter for preprocedural cardiovascular examination: Secondary | ICD-10-CM | POA: Diagnosis present

## 2024-02-24 NOTE — Progress Notes (Signed)

## 2024-02-26 ENCOUNTER — Ambulatory Visit
Admission: RE | Admit: 2024-02-26 | Discharge: 2024-02-26 | Disposition: A | Discharge status: Home / Self Care | Source: Ambulatory Visit | Attending: Surgery | Admitting: Surgery

## 2024-02-26 ENCOUNTER — Other Ambulatory Visit: Payer: Self-pay | Admitting: Surgery

## 2024-02-26 ENCOUNTER — Encounter: Payer: Self-pay | Admitting: Surgery

## 2024-02-26 DIAGNOSIS — C50912 Malignant neoplasm of unspecified site of left female breast: Secondary | ICD-10-CM

## 2024-02-26 HISTORY — PX: BREAST BIOPSY: SHX20

## 2024-03-01 ENCOUNTER — Ambulatory Visit (HOSPITAL_BASED_OUTPATIENT_CLINIC_OR_DEPARTMENT_OTHER): Admitting: Anesthesiology

## 2024-03-01 ENCOUNTER — Ambulatory Visit (HOSPITAL_BASED_OUTPATIENT_CLINIC_OR_DEPARTMENT_OTHER)
Admission: RE | Admit: 2024-03-01 | Discharge: 2024-03-01 | Disposition: A | Discharge status: Home / Self Care | Attending: Surgery | Admitting: Surgery

## 2024-03-01 ENCOUNTER — Other Ambulatory Visit: Payer: Self-pay

## 2024-03-01 ENCOUNTER — Ambulatory Visit
Admission: RE | Admit: 2024-03-01 | Discharge: 2024-03-01 | Disposition: A | Discharge status: Home / Self Care | Source: Ambulatory Visit | Attending: Surgery | Admitting: Surgery

## 2024-03-01 ENCOUNTER — Encounter (HOSPITAL_BASED_OUTPATIENT_CLINIC_OR_DEPARTMENT_OTHER): Payer: Self-pay | Admitting: Surgery

## 2024-03-01 ENCOUNTER — Encounter (HOSPITAL_BASED_OUTPATIENT_CLINIC_OR_DEPARTMENT_OTHER)
Admission: RE | Disposition: A | Discharge status: Home / Self Care | Payer: Self-pay | Source: Home / Self Care | Attending: Surgery

## 2024-03-01 DIAGNOSIS — Z1732 Human epidermal growth factor receptor 2 negative status: Secondary | ICD-10-CM | POA: Insufficient documentation

## 2024-03-01 DIAGNOSIS — Z170 Estrogen receptor positive status [ER+]: Secondary | ICD-10-CM | POA: Insufficient documentation

## 2024-03-01 DIAGNOSIS — Z1721 Progesterone receptor positive status: Secondary | ICD-10-CM | POA: Diagnosis not present

## 2024-03-01 DIAGNOSIS — Z79899 Other long term (current) drug therapy: Secondary | ICD-10-CM | POA: Insufficient documentation

## 2024-03-01 DIAGNOSIS — C50512 Malignant neoplasm of lower-outer quadrant of left female breast: Secondary | ICD-10-CM | POA: Diagnosis present

## 2024-03-01 DIAGNOSIS — I10 Essential (primary) hypertension: Secondary | ICD-10-CM | POA: Diagnosis not present

## 2024-03-01 DIAGNOSIS — Z8249 Family history of ischemic heart disease and other diseases of the circulatory system: Secondary | ICD-10-CM | POA: Diagnosis not present

## 2024-03-01 DIAGNOSIS — Z7985 Long-term (current) use of injectable non-insulin antidiabetic drugs: Secondary | ICD-10-CM | POA: Diagnosis not present

## 2024-03-01 DIAGNOSIS — C50912 Malignant neoplasm of unspecified site of left female breast: Secondary | ICD-10-CM

## 2024-03-01 DIAGNOSIS — C50412 Malignant neoplasm of upper-outer quadrant of left female breast: Secondary | ICD-10-CM

## 2024-03-01 DIAGNOSIS — E890 Postprocedural hypothyroidism: Secondary | ICD-10-CM | POA: Diagnosis not present

## 2024-03-01 HISTORY — PX: BREAST LUMPECTOMY WITH RADIOACTIVE SEED AND SENTINEL LYMPH NODE BIOPSY: SHX6550

## 2024-03-01 HISTORY — DX: Prediabetes: R73.03

## 2024-03-01 LAB — GLUCOSE, CAPILLARY
Glucose-Capillary: 97 mg/dL (ref 70–99)
Glucose-Capillary: 109 mg/dL — ABNORMAL HIGH (ref 70–99)

## 2024-03-01 SURGERY — BREAST LUMPECTOMY WITH RADIOACTIVE SEED AND SENTINEL LYMPH NODE BIOPSY
Anesthesia: General | Site: Breast | Laterality: Left

## 2024-03-01 MED ORDER — LIDOCAINE HCL (CARDIAC) PF 100 MG/5ML IV SOSY
PREFILLED_SYRINGE | INTRAVENOUS | Status: DC | PRN
  Administered 2024-03-01: 100 mg via INTRAVENOUS

## 2024-03-01 MED ORDER — MIDAZOLAM HCL 2 MG/2ML IJ SOLN
INTRAMUSCULAR | Status: AC
  Filled 2024-03-01: qty 2

## 2024-03-01 MED ORDER — PHENYLEPHRINE HCL (PRESSORS) 10 MG/ML IV SOLN
INTRAVENOUS | Status: DC | PRN
  Administered 2024-03-01 (×5): 160 ug via INTRAVENOUS

## 2024-03-01 MED ORDER — FENTANYL CITRATE (PF) 100 MCG/2ML IJ SOLN
100.0000 ug | Freq: Once | INTRAMUSCULAR | Status: AC
  Administered 2024-03-01: 100 ug via INTRAVENOUS

## 2024-03-01 MED ORDER — OXYCODONE HCL 5 MG/5ML PO SOLN: 5.0000 mg | Freq: Once | ORAL | Status: AC | PRN

## 2024-03-01 MED ORDER — MAGTRACE LYMPHATIC TRACER
INTRAMUSCULAR | Status: DC | PRN
  Administered 2024-03-01: 2 mL via INTRAMUSCULAR

## 2024-03-01 MED ORDER — ACETAMINOPHEN 500 MG PO TABS
ORAL_TABLET | ORAL | Status: AC
  Filled 2024-03-01: qty 2

## 2024-03-01 MED ORDER — BUPIVACAINE LIPOSOME 1.3 % IJ SUSP
INTRAMUSCULAR | Status: DC | PRN
Start: 2024-03-01 — End: 2024-03-01
  Administered 2024-03-01: 10 mL via PERINEURAL

## 2024-03-01 MED ORDER — ONDANSETRON HCL 4 MG/2ML IJ SOLN
INTRAMUSCULAR | Status: DC | PRN
  Administered 2024-03-01: 4 mg via INTRAVENOUS

## 2024-03-01 MED ORDER — MIDAZOLAM HCL 5 MG/5ML IJ SOLN
INTRAMUSCULAR | Status: DC | PRN
  Administered 2024-03-01: 2 mg via INTRAVENOUS

## 2024-03-01 MED ORDER — CHLORHEXIDINE GLUCONATE CLOTH 2 % EX PADS: 6.0000 | MEDICATED_PAD | Freq: Once | CUTANEOUS | Status: DC

## 2024-03-01 MED ORDER — CEFAZOLIN SODIUM-DEXTROSE 2-4 GM/100ML-% IV SOLN
2.0000 g | INTRAVENOUS | Status: AC
  Administered 2024-03-01: 2 g via INTRAVENOUS

## 2024-03-01 MED ORDER — BUPIVACAINE-EPINEPHRINE (PF) 0.5% -1:200000 IJ SOLN
INTRAMUSCULAR | Status: DC | PRN
Start: 2024-03-01 — End: 2024-03-01
  Administered 2024-03-01: 20 mL via PERINEURAL

## 2024-03-01 MED ORDER — DIPHENHYDRAMINE HCL 50 MG/ML IJ SOLN
INTRAMUSCULAR | Status: DC | PRN
  Administered 2024-03-01: 25 mg via INTRAVENOUS

## 2024-03-01 MED ORDER — MIDAZOLAM HCL 2 MG/2ML IJ SOLN
2.0000 mg | Freq: Once | INTRAMUSCULAR | Status: AC
  Administered 2024-03-01: 2 mg via INTRAVENOUS

## 2024-03-01 MED ORDER — FENTANYL CITRATE (PF) 100 MCG/2ML IJ SOLN
INTRAMUSCULAR | Status: AC
  Filled 2024-03-01: qty 2

## 2024-03-01 MED ORDER — DROPERIDOL 2.5 MG/ML IJ SOLN: 0.6250 mg | Freq: Once | INTRAMUSCULAR | Status: DC | PRN

## 2024-03-01 MED ORDER — OXYCODONE HCL 5 MG PO TABS: 5.0000 mg | ORAL_TABLET | Freq: Four times a day (QID) | ORAL | 0 refills | Status: DC | PRN

## 2024-03-01 MED ORDER — FENTANYL CITRATE (PF) 100 MCG/2ML IJ SOLN
INTRAMUSCULAR | Status: DC | PRN
  Administered 2024-03-01: 100 ug via INTRAVENOUS

## 2024-03-01 MED ORDER — DEXAMETHASONE SODIUM PHOSPHATE 10 MG/ML IJ SOLN
INTRAMUSCULAR | Status: AC
  Filled 2024-03-01: qty 1

## 2024-03-01 MED ORDER — PROPOFOL 10 MG/ML IV BOLUS
INTRAVENOUS | Status: DC | PRN
  Administered 2024-03-01: 170 mg via INTRAVENOUS

## 2024-03-01 MED ORDER — LACTATED RINGERS IV SOLN: INTRAVENOUS | Status: DC

## 2024-03-01 MED ORDER — FENTANYL CITRATE (PF) 100 MCG/2ML IJ SOLN: 25.0000 ug | INTRAMUSCULAR | Status: DC | PRN

## 2024-03-01 MED ORDER — OXYCODONE HCL 5 MG PO TABS
ORAL_TABLET | ORAL | Status: AC
  Filled 2024-03-01: qty 1

## 2024-03-01 MED ORDER — FENTANYL CITRATE (PF) 100 MCG/2ML IJ SOLN
INTRAMUSCULAR | Status: AC
Start: 2024-03-01 — End: 2024-03-01
  Filled 2024-03-01: qty 2

## 2024-03-01 MED ORDER — PROPOFOL 10 MG/ML IV BOLUS
INTRAVENOUS | Status: AC
  Filled 2024-03-01: qty 20

## 2024-03-01 MED ORDER — ONDANSETRON HCL 4 MG/2ML IJ SOLN
INTRAMUSCULAR | Status: AC
  Filled 2024-03-01: qty 2

## 2024-03-01 MED ORDER — CEFAZOLIN SODIUM-DEXTROSE 2-4 GM/100ML-% IV SOLN
INTRAVENOUS | Status: AC
  Filled 2024-03-01: qty 100

## 2024-03-01 MED ORDER — ACETAMINOPHEN 500 MG PO TABS: 1000.0000 mg | ORAL_TABLET | ORAL | Status: DC

## 2024-03-01 MED ORDER — IBUPROFEN 800 MG PO TABS: 800.0000 mg | ORAL_TABLET | Freq: Three times a day (TID) | ORAL | 0 refills | Status: AC | PRN

## 2024-03-01 MED ORDER — OXYCODONE HCL 5 MG PO TABS
5.0000 mg | ORAL_TABLET | Freq: Once | ORAL | Status: AC | PRN
  Administered 2024-03-01: 5 mg via ORAL

## 2024-03-01 MED ORDER — DEXAMETHASONE SODIUM PHOSPHATE 4 MG/ML IJ SOLN
INTRAMUSCULAR | Status: DC | PRN
  Administered 2024-03-01: 5 mg via INTRAVENOUS

## 2024-03-01 MED ORDER — 0.9 % SODIUM CHLORIDE (POUR BTL) OPTIME
TOPICAL | Status: DC | PRN
  Administered 2024-03-01: 100 mL

## 2024-03-01 MED ORDER — BUPIVACAINE-EPINEPHRINE (PF) 0.25% -1:200000 IJ SOLN
INTRAMUSCULAR | Status: DC | PRN
Start: 2024-03-01 — End: 2024-03-01
  Administered 2024-03-01: 16 mL

## 2024-03-01 SURGICAL SUPPLY — 43 items
BINDER BREAST LRG (GAUZE/BANDAGES/DRESSINGS) IMPLANT
BINDER BREAST MEDIUM (GAUZE/BANDAGES/DRESSINGS) IMPLANT
BINDER BREAST XLRG (GAUZE/BANDAGES/DRESSINGS) IMPLANT
BINDER BREAST XXLRG (GAUZE/BANDAGES/DRESSINGS) IMPLANT
BLADE SURG 15 STRL LF DISP TIS (BLADE) ×1 IMPLANT
CANISTER SUC SOCK COL 7IN (MISCELLANEOUS) IMPLANT
CANISTER SUCT 1200ML W/VALVE (MISCELLANEOUS) ×1 IMPLANT
CHLORAPREP W/TINT 26 (MISCELLANEOUS) ×1 IMPLANT
CLIP APPLIE 9.375 MED OPEN (MISCELLANEOUS) ×1 IMPLANT
COVER BACK TABLE 60X90IN (DRAPES) ×1 IMPLANT
COVER MAYO STAND STRL (DRAPES) ×1 IMPLANT
COVER PROBE CYLINDRICAL 5X96 (MISCELLANEOUS) ×1 IMPLANT
DERMABOND ADVANCED .7 DNX12 (GAUZE/BANDAGES/DRESSINGS) ×1 IMPLANT
DRAPE LAPAROSCOPIC ABDOMINAL (DRAPES) ×1 IMPLANT
DRAPE UTILITY XL STRL (DRAPES) ×1 IMPLANT
ELECT COATED BLADE 2.86 ST (ELECTRODE) ×1 IMPLANT
ELECTRODE REM PT RTRN 9FT ADLT (ELECTROSURGICAL) ×1 IMPLANT
GLOVE BIOGEL PI IND STRL 6.5 (GLOVE) IMPLANT
GLOVE BIOGEL PI IND STRL 7.0 (GLOVE) IMPLANT
GLOVE BIOGEL PI IND STRL 8 (GLOVE) ×1 IMPLANT
GLOVE ECLIPSE 8.0 STRL XLNG CF (GLOVE) ×1 IMPLANT
GOWN STRL REUS W/ TWL LRG LVL3 (GOWN DISPOSABLE) ×2 IMPLANT
GOWN STRL REUS W/ TWL XL LVL3 (GOWN DISPOSABLE) ×1 IMPLANT
HEMOSTAT ARISTA ABSORB 3G PWDR (HEMOSTASIS) IMPLANT
HEMOSTAT SNOW SURGICEL 2X4 (HEMOSTASIS) IMPLANT
KIT MARKER MARGIN INK (KITS) ×1 IMPLANT
NDL HYPO 25X1 1.5 SAFETY (NEEDLE) ×1 IMPLANT
NDL SAFETY ECLIPSE 18X1.5 (NEEDLE) IMPLANT
NEEDLE HYPO 25X1 1.5 SAFETY (NEEDLE) ×1 IMPLANT
NS IRRIG 1000ML POUR BTL (IV SOLUTION) ×1 IMPLANT
PACK BASIN DAY SURGERY FS (CUSTOM PROCEDURE TRAY) ×1 IMPLANT
PENCIL SMOKE EVACUATOR (MISCELLANEOUS) ×1 IMPLANT
SLEEVE SCD COMPRESS KNEE MED (STOCKING) ×1 IMPLANT
SPIKE FLUID TRANSFER (MISCELLANEOUS) IMPLANT
SPONGE T-LAP 4X18 ~~LOC~~+RFID (SPONGE) ×1 IMPLANT
SUT MNCRL AB 4-0 PS2 18 (SUTURE) ×1 IMPLANT
SUT VICRYL 3-0 CR8 SH (SUTURE) ×1 IMPLANT
SYR CONTROL 10ML LL (SYRINGE) ×1 IMPLANT
TOWEL GREEN STERILE FF (TOWEL DISPOSABLE) ×1 IMPLANT
TRACER MAGTRACE VIAL (MISCELLANEOUS) IMPLANT
TRAY FAXITRON CT DISP (TRAY / TRAY PROCEDURE) ×1 IMPLANT
TUBE CONNECTING 20X1/4 (TUBING) ×1 IMPLANT
YANKAUER SUCT BULB TIP NO VENT (SUCTIONS) ×1 IMPLANT

## 2024-03-01 NOTE — Discharge Instructions (Addendum)
 [facility] Office Phone Number [phone]  BREAST BIOPSY/ PARTIAL MASTECTOMY: POST OP INSTRUCTIONS  Always review your discharge instruction sheet given to you by the facility where your surgery was performed.  IF YOU HAVE DISABILITY OR FAMILY LEAVE FORMS, YOU MUST BRING THEM TO THE OFFICE FOR PROCESSING.  DO NOT GIVE THEM TO YOUR DOCTOR.  A prescription for pain medication may be given to you upon discharge.  Take your pain medication as prescribed, if needed.  If narcotic pain medicine is not needed, then you may take acetaminophen (Tylenol) or ibuprofen  (Advil ) as needed. Take your usually prescribed medications unless otherwise directed If you need a refill on your pain medication, please contact your pharmacy.  They will contact our office to request authorization.  Prescriptions will not be filled after 5pm or on week-ends. You should eat very light the first 24 hours after surgery, such as soup, crackers, pudding, etc.  Resume your normal diet the day after surgery. Most patients will experience some swelling and bruising in the breast.  Ice packs and a good support bra will help.  Swelling and bruising can take several days to resolve.  It is common to experience some constipation if taking pain medication after surgery.  Increasing fluid intake and taking a stool softener will usually help or prevent this problem from occurring.  A mild laxative (Milk of Magnesia or Miralax) should be taken according to package directions if there are no bowel movements after 48 hours. Unless discharge instructions indicate otherwise, you may remove your bandages 24-48 hours after surgery, and you may shower at that time.  You may have steri-strips (small skin tapes) in place directly over the incision.  These strips should be left on the skin for 7-10 days.  If your surgeon used skin glue on the incision, you may shower in 24 hours.  The glue will flake off over the next 2-3 weeks.  Any  sutures or staples will be removed at the office during your follow-up visit. ACTIVITIES:  You may resume regular daily activities (gradually increasing) beginning the next day.  Wearing a good support bra or sports bra minimizes pain and swelling.  You may have sexual intercourse when it is comfortable. You may drive when you no longer are taking prescription pain medication, you can comfortably wear a seatbelt, and you can safely maneuver your car and apply brakes. RETURN TO WORK:  ______________________________________________________________________________________ Jacqueline Mayer should see your doctor in the office for a follow-up appointment approximately two weeks after your surgery.  Your doctor's nurse will typically make your follow-up appointment when she calls you with your pathology report.  Expect your pathology report 2-3 business days after your surgery.  You may call to check if you do not hear from us  after three days. OTHER INSTRUCTIONS: _______________________________________________________________________________________________ _____________________________________________________________________________________________________________________________________ _____________________________________________________________________________________________________________________________________ _____________________________________________________________________________________________________________________________________  WHEN TO CALL YOUR DOCTOR: Fever over 101.0 Nausea and/or vomiting. Extreme swelling or bruising. Continued bleeding from incision. Increased pain, redness, or drainage from the incision.  The clinic staff is available to answer your questions during regular business hours.  Please don't hesitate to call and ask to speak to one of the nurses for clinical concerns.  If you have a medical emergency, go to the nearest emergency room or call 911.  A surgeon from [facility] is always on call at the hospital.  For further questions, please visit [email or web address]   Post Anesthesia Home Care Instructions  Activity: Get plenty of rest for the remainder of the  day. A responsible individual must stay with you for 24 hours following the procedure.  For the next 24 hours, DO NOT: -Drive a car -Advertising copywriter -Drink alcoholic beverages -Take any medication unless instructed by your physician -Make any legal decisions or sign important papers.  Meals: Start with liquid foods such as gelatin or soup. Progress to regular foods as tolerated. Avoid greasy, spicy, heavy foods. If nausea and/or vomiting occur, drink only clear liquids until the nausea and/or vomiting subsides. Call your physician if vomiting continues.  Special Instructions/Symptoms: Your throat may feel dry or sore from the anesthesia or the breathing tube placed in your throat during surgery. If this causes discomfort, gargle with warm salt water. The discomfort should disappear within 24 hours.  If you had a scopolamine patch placed behind your ear for the management of post- operative nausea and/or vomiting:  1. The medication in the patch is effective for 72 hours, after which it should be removed.  Wrap patch in a tissue and discard in the trash. Wash hands thoroughly with soap and water. 2. You may remove the patch earlier than 72 hours if you experience unpleasant side effects which may include dry mouth, dizziness or visual disturbances. 3. Avoid touching the patch. Wash your hands with soap and water after contact with the patch.      Next dose of tylenol may be taken at 6:30p Next dose of Oxycodone may be taken at 11pm.

## 2024-03-01 NOTE — Anesthesia Preprocedure Evaluation (Addendum)
 Anesthesia Evaluation  Patient identified by MRN, date of birth, ID band Patient awake    Reviewed: Allergy & Precautions, NPO status , Patient's Chart, lab work & pertinent test results, reviewed documented beta blocker date and time   History of Anesthesia Complications Negative for: history of anesthetic complications  Airway Mallampati: II  TM Distance: >3 FB Neck ROM: Full    Dental  (+) Missing,    Pulmonary neg pulmonary ROS   Pulmonary exam normal        Cardiovascular hypertension, Pt. on medications and Pt. on home beta blockers Normal cardiovascular exam     Neuro/Psych negative neurological ROS     GI/Hepatic negative GI ROS, Neg liver ROS,,,  Endo/Other  Hypothyroidism  On Mounjaro  Renal/GU negative Renal ROS  negative genitourinary   Musculoskeletal negative musculoskeletal ROS (+)    Abdominal   Peds  Hematology negative hematology ROS (+)   Anesthesia Other Findings Breast ca  Reproductive/Obstetrics                             Anesthesia Physical Anesthesia Plan  ASA: 2  Anesthesia Plan: General   Post-op Pain Management: Regional block* and Tylenol PO (pre-op)*   Induction: Intravenous  PONV Risk Score and Plan: 3 and Treatment may vary due to age or medical condition, Ondansetron, Dexamethasone and Midazolam  Airway Management Planned: LMA  Additional Equipment: None  Intra-op Plan:   Post-operative Plan: Extubation in OR  Informed Consent: I have reviewed the patients History and Physical, chart, labs and discussed the procedure including the risks, benefits and alternatives for the proposed anesthesia with the patient or authorized representative who has indicated his/her understanding and acceptance.     Dental advisory given  Plan Discussed with: CRNA  Anesthesia Plan Comments:        Anesthesia Quick Evaluation

## 2024-03-01 NOTE — H&P (Signed)
 History of Present Illness: Jacqueline Mayer is a 58 y.o. 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 Denies any significant plaint of breast pain, nipple pain or nipple discharge or breast mass.  Review of Systems: A complete review of systems was obtained from the patient. I have reviewed this information and discussed as appropriate with the patient. See HPI as well for other ROS.    Medical History: Past Medical History:  Diagnosis Date  Anxiety  Hyperlipidemia  Hypertension  Thyroid  disease   Patient Active Problem List  Diagnosis  Epigastric abdominal pain  Hypertensive disorder  Fam hx-ischem heart disease  Family history of diabetes mellitus  Family history of systemic lupus erythematosus  Functional dyspepsia  H/O Graves' disease  History of vertigo  Hypothyroidism  Inflammation of sacroiliac joint ()  Malignant neoplasm of upper-outer quadrant of left breast in female, estrogen receptor positive (CMS/HHS-HCC)  Menopausal syndrome  Meralgia paresthetica of right side  Mild intermittent asthma with (acute) exacerbation (HHS-HCC)  Other obesity due to excess calories  Postoperative hypothyroidism  Vertigo  Type 2 diabetes mellitus (CMS/HHS-HCC)  Spondylosis without myelopathy  Pure hypercholesterolemia  Ptosis, right eyelid  Pre-diabetes  Weight loss  Blood in urine   Past Surgical History:  Procedure Laterality Date  COLONOSCOPY N/A 03/29/2021  breast biopsy left Left 01/22/2024  breast biopsy left Left 01/28/2024  cesarean section N/A  Unknown date    Allergies  Allergen Reactions  Sulfamethoxazole-Trimethoprim Swelling and Other (See Comments)  Tongue swells  Bee Venom Protein (Honey Bee) Unknown  Sulfa (Sulfonamide Antibiotics) Swelling, Angioedema and Other (See Comments)    Current Outpatient Medications on File Prior to Visit  Medication Sig Dispense Refill  ALPRAZolam (XANAX) 0.25 MG tablet Take 1 tablet by mouth 3 (three) times daily  aspirin  81 MG chewable tablet Take 1 tablet by mouth once daily  atorvastatin (LIPITOR) 20 MG tablet Take 1 tablet by mouth once daily  bisoproloL-hydroCHLOROthiazide (ZIAC) 10-6.25 mg tablet Take 1 tablet by mouth once daily  EPINEPHrine  (EPIPEN ) 0.3 mg/0.3 mL auto-injector Inject 0.3 mg into the muscle as needed for Anaphylaxis  ezetimibe (ZETIA) 10 mg tablet Take 1 tablet by mouth once daily  levothyroxine (SYNTHROID) 75 MCG tablet Take 1 tablet by mouth every morning before breakfast (0630)  MOUNJARO 7.5 mg/0.5 mL pen injector Inject 7.5 mg subcutaneously once a week  norgestimate-ethinyl estradioL (SPRINTEC 0.25/35, 28,) 0.25-0.035 mg tablet Take 1 tablet by mouth once daily  norgestrel-ethinyl estradioL (LOW-OGESTREL, 28,) 0.3-30 mg-mcg tablet Take 1 tablet by mouth once daily  semaglutide (OZEMPIC) 1 mg/dose (4 mg/3 mL) pen injector Inject 1 mg subcutaneously once a week  zolpidem (AMBIEN) 5 MG tablet Take 1 tablet by mouth at bedtime   No current facility-administered medications on file prior to visit.   Family History  Problem Relation Age of Onset  Colon cancer Mother  Diabetes Mother  Coronary Artery Disease (Blocked arteries around heart) Mother  Hyperlipidemia (Elevated cholesterol) Mother  Diabetes Father    Social History   Tobacco Use  Smoking Status Never  Smokeless Tobacco Never    Social History   Socioeconomic History  Marital status: Married  Tobacco Use  Smoking status: Never  Smokeless tobacco: Never  Vaping Use  Vaping status: Never Used  Substance and Sexual Activity  Alcohol use: Yes  Comment: 3x  weekly  Drug use: Never   Social Drivers of Health   Received from [employer]  Social Network   Objective:  There were no vitals filed for this visit.  There is no height  or weight on file to calculate BMI.  Physical Exam Exam conducted with a chaperone present.  Eyes:  Pupils: Pupils are equal, round, and reactive to light.  Cardiovascular:  Rate and Rhythm: Normal rate.  Pulmonary:  Effort: Pulmonary effort is normal.  Chest:  Breasts: Right: Normal.  Left: Mass present.   Comments: Bruising left breast lower outer quadrant noted. Musculoskeletal:  Cervical back: Normal range of motion.  Lymphadenopathy:  Upper Body:  Right upper body: No supraclavicular or axillary adenopathy.  Left upper body: No supraclavicular or axillary adenopathy.  Skin: General: Skin is warm.  Neurological:  General: No focal deficit present.  Mental Status: She is alert.  Psychiatric:  Mood and Affect: Mood normal.  Behavior: Behavior normal.     Labs, Imaging and Diagnostic Testing: CLINICAL DATA: Recall from screening to evaluate a possible left  breast mass.   EXAM:  DIGITAL DIAGNOSTIC UNILATERAL LEFT MAMMOGRAM WITH TOMOSYNTHESIS AND  CAD; ULTRASOUND LEFT BREAST LIMITED   TECHNIQUE:  Left digital diagnostic mammography and breast tomosynthesis was  performed. The images were evaluated with computer-aided detection.  ; Targeted ultrasound examination of the left breast was performed.   COMPARISON: Previous exam(s).   ACR Breast Density Category c: The breasts are heterogeneously  dense, which may obscure small masses.   FINDINGS:  Additional images of the left breast demonstrate a subcentimeter  spiculated mass over the posterior outer midportion of the left  breast. There is a 3 mm group of round microcalcifications 5-6 mm  immediately anterior to the mass on both the lateral and CC images.  There is an additional loosely associated group of indeterminate  microcalcifications over the outer mid to lower left breast spanning  approximately 1.7 cm.   Targeted ultrasound is performed, showing an oval hypoechoic mass  with indistinct borders and  shadowing at the 3 o'clock position of  the left breast 7 cm from the nipple measuring 7 x 7 x 8 mm  corresponding to the mammographic finding.   Ultrasound the left axilla is normal.   IMPRESSION:  1. Suspicious 8 mm mass over the 3 o'clock position of the left  breast. No abnormal left axillary lymph nodes.   2. 3 mm group of indeterminate microcalcifications 5-6 mm  immediately anterior to the suspicious mass at the 3 o'clock  position. Additional loosely associated indeterminate 1.7 cm group  of microcalcifications over the outer mid to lower left breast.   RECOMMENDATION:  1. Ultrasound-guided core needle biopsy of the suspicious left  breast mass at the 3 o'clock position.   2. Recommend stereotactic core needle biopsy of the 1.7 cm group of  microcalcifications over the outer mid to lower left breast.   I have discussed the findings and recommendations with the patient.  If applicable, a reminder letter will be sent to the patient  regarding the next appointment.   BI-RADS CATEGORY 5: Highly suggestive of malignancy.   Biopsy scheduling will be facilitated by the ultrasound  technologist.   Electronically Signed  By: Toribio Agreste M.D.  FINAL DIAGNOSIS  1. Breast, left, needle core biopsy, mass, 3:00, 7 cmfn, clip heart : INVASIVE MODERATELY DIFFERENTIATED DUCTAL ADENOCARCINOMA, GRADE 2 (3+2+1) TUBULE FORMATION: SCORE 3 NUCLEAR PLEOMORPHISM: SCORE 2 MITOTIC COUNT: SCORE 1 TOTAL SCORE: 6  OVERALL GRADE: GRADE 2 (6/9) NEGATIVE FOR LYMPHOVASCULAR INVASION NEGATIVE FOR MICROCALCIFICATIONS TUMOR MEASURES 7.5 MM IN GREATEST LINEAR EXTENT  Diagnosis Note : Immunohistochemical stains for the breast prognostic markers have been ordered, and these results will be issued within a subsequent addendum to this report. Case is reviewed by Dr. Macie who concurs with the interpretation. Diagnosis called to [facility] at [facility] by Dr. Reed on  01/26/2024 at 8:53 AM.  DATE SIGNED OUT: 01/26/2024 ELECTRONIC SIGNATURE : Picklesimer Md, Fred , Sports administrator, Electronic Signature  MICROSCOPIC DESCRIPTION  CASE COMMENTS STAINS USED IN DIAGNOSIS: H&E-2 H&E-3 H&E-4 H&E Her2 by IHC ER-ACIS KI-67-ACIS PR-ACIS *RECUT 1 SLIDE Stains used in diagnosis 1 Her2 FISH  ADDENDUM Breast, left, needle core biopsy, mass, 3:00, 7 cmfn, clip heart PROGNOSTIC INDICATORS  Results: IMMUNOHISTOCHEMICAL AND MORPHOMETRIC ANALYSIS PERFORMED MANUALLY The tumor cells are EQUIVOCAL for Her2 (2+). Her2 by FISH will be performed and the results reported separately. Estrogen Receptor: 100%, POSITIVE, STRONG STAINING INTENSITY Progesterone Receptor: 100%, POSITIVE, STRONG STAINING INTENSITY Proliferation Marker Ki67: 2% REFERENCE RANGE ESTROGEN RECEPTOR NEGATIVE 0% POSITIVE =>1% REFERENCE RANGE PROGESTERONE RECEPTOR NEGATIVE 0% POSITIVE =>1% All controls stained appropriately Pepper Macie Md, Pathologist, Electronic Signature ( Signed 05 28 2025) 1A-Breast,left,needle core biopsy FLUORESCENCE IN-SITU HYBRIDIZATION  Results: GROUP 5: HER2 **NEGATIVE**  Equivocal form of amplification of the HER2 gene was detected in the IHC 2+ tissue sample received from this individual. HER2 FISH was performed by a technologist and cell imaging and analysis on the BioView. RATIO OF HER2/CEN17 SIGNALS 0.94 AVERAGE HER2 COPY NUMBER PER CELL 2.30 The ratio of HER2/CEN 17 is within the range < 2.0 of HER2/CEN 17 and a copy number of HER2 signals per cell is <4.0. Arch Pathol Lab Med 1:1,2018 Picklesimer Md, Prentice , Sports administrator, International aid/development worker ( Signed [phone])  CLINICAL HISTORY  SPECIMEN(S) OBTAINED 1. Breast, left, needle core biopsy, Mass, 3:00, 7 Cmfn, Clip Heart  SPECIMEN COMMENTS: 1. In formalin: 12:10, CIT < 1 min, 8 mm left breast spiculated mass at 3:00, 7 cmfn SPECIMEN CLINICAL INFORMATION:  Gross Description 1. The specimen is received in  formalin labeled with the patient's name and left breast 3 o'clock 7 cm fn, and consists of multiple cores of tan yellow, fibroadipose tissue ranging from 0.5 x 0.1 x 0.1 cm to 1.1 x 0.1 x 0.1 cm. The specimen is entirely submitted in one cassette. Time in formalin 12:10 p.m.on 01/22/24. CIT less than 1 minute. (KL:kh 01/26/24)  Report signed out from the following location(s) [place] [address] [place], [place] [address] CLIA #: [id number]  [facility] [address] [place], [place] [address] CLIA #: [id number] [place]   Assessment and Plan:   Diagnoses and all orders for this visit:  Malignant neoplasm of upper-outer quadrant of left breast in female, estrogen receptor positive (CMS/HHS-HCC)   Reviewed surgical options. Long-term survival, local regional recurrence as well as quality life reviewed today. Patient is opted for left breast seed local lumpectomy with left axillary lymph node mapping.The procedure has been discussed with the patient. Alternatives to surgery have been discussed with the patient. Risks of surgery include bleeding, Infection, Seroma formation, death, and the need for further surgery. Reviewed sentinel lymph node mapping and risk of lymphedema. She was seen today by physical therapy. Risk of bleeding, infection, nerve injury, blood vessel injury, seroma, wound complications and cosmetic issues. The patient understands and wishes to proceed.   Yehudah Standing  CURTISTINE SHIPPER, MD

## 2024-03-01 NOTE — Anesthesia Procedure Notes (Signed)
 Procedure Name: LMA Insertion Date/Time: 03/01/2024 2:24 PM  Performed by: Burnard Rosaline HERO, CRNAPre-anesthesia Checklist: Patient identified, Emergency Drugs available, Suction available and Patient being monitored Patient Re-evaluated:Patient Re-evaluated prior to induction Oxygen Delivery Method: Circle system utilized Preoxygenation: Pre-oxygenation with 100% oxygen Induction Type: IV induction Ventilation: Mask ventilation without difficulty LMA: LMA inserted LMA Size: 4.0 Number of attempts: 1 Airway Equipment and Method: Bite block Placement Confirmation: positive ETCO2 Tube secured with: Tape Dental Injury: Teeth and Oropharynx as per pre-operative assessment

## 2024-03-01 NOTE — Interval H&P Note (Signed)
 History and Physical Interval Note:  03/01/2024 1:34 PM  Jacqueline Mayer  has presented today for surgery, with the diagnosis of LEFT BREAST CANCER.  The various methods of treatment have been discussed with the patient and family. After consideration of risks, benefits and other options for treatment, the patient has consented to  Procedure(s) with comments: LEFT BREAST LUMPECTOMY WITH RADIOACTIVE SEED AND SENTINEL LYMPH NODE BIOPSY (Left) - LEFT BREAST SEED LUMPECTOMY LEFT AXILLARY SENTINEL LYMPH NODE MAPPING as a surgical intervention.  The patient's history has been reviewed, patient examined, no change in status, stable for surgery.  I have reviewed the patient's chart and labs.  Questions were answered to the patient's satisfaction.     Reshard Guillet A Altha Sweitzer

## 2024-03-01 NOTE — Transfer of Care (Signed)
 Immediate Anesthesia Transfer of Care Note  Patient: Jacqueline Mayer  Procedure(s) Performed: LEFT BREAST LUMPECTOMY WITH RADIOACTIVE SEED AND SENTINEL LYMPH NODE BIOPSY (Left: Breast)  Patient Location: PACU  Anesthesia Type:General and Regional  Level of Consciousness: awake, alert , and oriented  Airway & Oxygen Therapy: Patient Spontanous Breathing and Patient connected to face mask oxygen  Post-op Assessment: Report given to RN and Post -op Vital signs reviewed and stable  Post vital signs: Reviewed and stable  Last Vitals:  Vitals Value Taken Time  BP 115/72 03/01/24 15:45  Temp    Pulse 74 03/01/24 15:46  Resp 22 03/01/24 15:46  SpO2 96 % 03/01/24 15:46  Vitals shown include unfiled device data.  Last Pain:  Vitals:   03/01/24 1219  TempSrc: Temporal  PainSc: 0-No pain      Patients Stated Pain Goal: 3 (03/01/24 1219)  Complications: No notable events documented.

## 2024-03-01 NOTE — Op Note (Signed)
 Preoperative diagnosis: Left breast cancer upper outer quadrant ER positive  Postoperative diagnosis: Same  Procedure: Left breast seed localized lumpectomy with left axillary deep sentinel lymph node mapping using mag trace injection  Surgeon: Debby Shipper, MD  Anesthesia: LMA with 0.25% Marcaine with epinephrine  local and left pectoral block per anesthesia  Drains: None  Specimen: Left breast tissue containing seed and 2 clips verified by [employer] imaging with grossly negative margins, 2 left axillary level 1 deep sentinel nodes   EBL: Minimal  Indications for procedure: The patient is a 58 year old female with stage I left breast cancer.  She was seen in a multidisciplinary clinic setting.  She opted for breast conserving surgery and presents for that today.The procedure has been discussed with the patient. Alternatives to surgery have been discussed with the patient.  Risks of surgery include bleeding,  Infection,  Seroma formation, death,  and the need for further surgery.   The patient understands and wishes to proceed. Sentinel lymph node mapping and dissection has been discussed with the patient.  Risk of bleeding,  Infection,  Seroma formation,  Additional procedures,,  Shoulder weakness ,  Shoulder stiffness,  Nerve and blood vessel injury and reaction to the mapping dyes have been discussed.  Alternatives to surgery have been discussed with the patient.  The patient agrees to proceed.   Description of procedure: The patient was met in the holding area questions were answered.  The left breast was marked as the correct site.  Left pectoral block was administered by anesthesia.  Of note his seed was placed as an outpatient.  She was then taken back to the operative room.  She was placed upon upon the operating table.  After induction of general anesthesia, the left breast was prepped sterilely and 2 cc of mag trace were injected in the deep breast tissue.  5 minutes of massage ensued.   The left breast was then prepped and draped a second time and a second timeout performed.  Neoprobe used to identify the seed in the left lateral breast.  A curvilinear incision was made over the signal.  Dissection was carried down all tissue and the seed and clip were excised with grossly negative margins.  The anterior margin was skin.  Deep margin was pectoralis muscle.  Imaging revealed the seed and clip to be present an additional biopsy clip were present as well.  Of note the tissue was oriented with ink.  This was sent to pathology.  Clips were used to mark the cavity and the deep tissue planes were approximated with 3-0 Vicryl.  4 Monocryl used to close the skin in a subcuticular fashion.  The mag trace probe was used.  A hotspot identified the left axilla and a 4 cm incision was made.  Dissection was carried down to the level 1 deep contents.  There were 2 nodes identified that it taken up the tracer.  Background counts showed no significant spike.  The long thoracic nerve, thoracodorsal trunk and extra vein were all preserved.  Irrigation was used.  There is no bleeding.  Arista was placed.  The deep tissue planes were approximated with 3-0 Vicryl.  4 Monocryl was used to close the skin in a subcuticular fashion.  Dermabond applied.  All counts were found to be correct.  Breast binder placed.  The patient was awoke extubated taken to recovery in satisfactory condition.

## 2024-03-01 NOTE — Anesthesia Procedure Notes (Signed)
 Anesthesia Regional Block: Pectoralis block   Pre-Anesthetic Checklist: , timeout performed,  Correct Patient, Correct Site, Correct Laterality,  Correct Procedure, Correct Position, site marked,  Risks and benefits discussed,  Pre-op evaluation,  At surgeon's request and post-op pain management  Laterality: Left  Prep: Maximum Sterile Barrier Precautions used, chloraprep       Needles:  Injection technique: Single-shot  Needle Type: Echogenic Stimulator Needle     Needle Length: 9cm  Needle Gauge: 22     Additional Needles:   Procedures:,,,, ultrasound used (permanent image in chart),,    Narrative:  Start time: 03/01/2024 1:48 PM End time: 03/01/2024 1:51 PM Injection made incrementally with aspirations every 5 mL.  Performed by: Personally  Anesthesiologist: Paul Lamarr BRAVO, MD  Additional Notes: Risks, benefits, and alternative discussed. Patient gave consent for procedure. Patient prepped and draped in sterile fashion. Sedation administered, patient remains easily responsive to voice. Relevant anatomy identified with ultrasound guidance. Local anesthetic given in 5cc increments with no signs or symptoms of intravascular injection. No pain or paraesthesias with injection. Patient monitored throughout procedure with signs of LAST or immediate complications. Tolerated well. Ultrasound image placed in chart.  LANEY Paul, MD

## 2024-03-01 NOTE — Progress Notes (Signed)
 Assisted Dr. Stephannie Peters with left, pectoralis, ultrasound guided block. Side rails up, monitors on throughout procedure. See vital signs in flow sheet. Tolerated Procedure well.

## 2024-03-01 NOTE — Anesthesia Postprocedure Evaluation (Signed)
 Anesthesia Post Note  Patient: Jacqueline Mayer  Procedure(s) Performed: LEFT BREAST LUMPECTOMY WITH RADIOACTIVE SEED AND SENTINEL LYMPH NODE BIOPSY (Left: Breast)     Patient location during evaluation: PACU Anesthesia Type: General and Regional Level of consciousness: awake Pain management: pain level controlled Vital Signs Assessment: post-procedure vital signs reviewed and stable Respiratory status: spontaneous breathing, nonlabored ventilation and respiratory function stable Cardiovascular status: blood pressure returned to baseline and stable Postop Assessment: no apparent nausea or vomiting Anesthetic complications: no   No notable events documented.  Last Vitals:  Vitals:   03/01/24 1615 03/01/24 1633  BP: 112/72 123/75  Pulse: 64 67  Resp: 13 14  Temp:  36.8 C  SpO2: 98% 100%    Last Pain:  Vitals:   03/01/24 1654  TempSrc:   PainSc: 3                  Abigail Teall P Jillayne Witte

## 2024-03-02 ENCOUNTER — Encounter (HOSPITAL_BASED_OUTPATIENT_CLINIC_OR_DEPARTMENT_OTHER): Payer: Self-pay | Admitting: Surgery

## 2024-03-07 LAB — SURGICAL PATHOLOGY

## 2024-03-08 ENCOUNTER — Telehealth: Payer: Self-pay | Admitting: *Deleted

## 2024-03-08 ENCOUNTER — Ambulatory Visit: Payer: Self-pay | Admitting: Surgery

## 2024-03-08 ENCOUNTER — Encounter: Payer: Self-pay | Admitting: *Deleted

## 2024-03-08 NOTE — Telephone Encounter (Signed)
 Ordered oncotype per Dr. Pamelia Hoit. Sent requisition to pathology and exact sciences.

## 2024-03-18 ENCOUNTER — Encounter (HOSPITAL_COMMUNITY): Payer: Self-pay

## 2024-03-18 ENCOUNTER — Telehealth: Payer: Self-pay | Admitting: *Deleted

## 2024-03-18 ENCOUNTER — Encounter: Payer: Self-pay | Admitting: *Deleted

## 2024-03-18 DIAGNOSIS — Z170 Estrogen receptor positive status [ER+]: Secondary | ICD-10-CM

## 2024-03-18 NOTE — Telephone Encounter (Signed)
 Received oncotype results 7/3% Referral placed for Dr. Dewey

## 2024-03-20 NOTE — Assessment & Plan Note (Signed)
 Constitutional: Denies fevers, chills or abnormal night sweats Breast:  Denies any palpable lumps or discharge All other systems were reviewed with the patient and are negative.   PHYSICAL EXAMINATION: ECOG PERFORMANCE STATUS: 0 - Asymptomatic   There were no vitals filed for this visit. There were no vitals filed for this visit.   GENERAL:alert, no distress and comfortable      RADIOGRAPHIC STUDIES: I have personally reviewed the radiological reports and agreed with the findings in the report.   ASSESSMENT AND PLAN:  Malignant neoplasm of upper-outer quadrant of left breast in female, estrogen receptor positive (HCC) 01/28/2024:Screening mammogram detected left breast mass at 3 o'clock position 0.8 cm: Biopsy: Grade 2 IDC ER 100%, PR 100%, Ki67 2%, HER2 equivocal by IHC FISH negative, posterior left breast calcifications 1.7 cm: Biopsy: Benign concordant, axilla negative    Recommendations: 1. Breast conserving surgery followed by 2. Oncotype DX testing to determine if chemotherapy would be of any benefit followed by 3. Adjuvant radiation therapy followed by 4. Adjuvant antiestrogen therapy

## 2024-03-20 NOTE — Therapy (Signed)
 OUTPATIENT PHYSICAL THERAPY BREAST CANCER POST OP FOLLOW UP   Patient Name: Jacqueline Mayer MRN: [id number] DOB:1965/12/09, 58 y.o., female Today's Date: 03/21/2024  END OF SESSION:  PT End of Session - 03/21/24 0838     Visit Number 2    Number of Visits 2    Date for PT Re-Evaluation 03/30/24    PT Start Time 0804    PT Stop Time 0838    PT Time Calculation (min) 34 min    Activity Tolerance Patient tolerated treatment well    Behavior During Therapy [facility] for tasks assessed/performed          Past Medical History:  Diagnosis Date   Breast cancer (HCC)    left breast IDC   Hyperlipidemia    Hypertension    Pre-diabetes    Thyroid  disease    Past Surgical History:  Procedure Laterality Date   BREAST BIOPSY Left 01/22/2024   US  LT BREAST BX W LOC DEV 1ST LESION IMG BX SPEC US  GUIDE 01/22/2024 [facility]   BREAST BIOPSY Left 01/28/2024   MM LT BREAST BX W LOC DEV 1ST LESION IMAGE BX SPEC STEREO GUIDE 01/28/2024 [facility]   BREAST BIOPSY  02/26/2024   MM LT RADIOACTIVE SEED LOC MAMMO GUIDE 02/26/2024 [facility]   BREAST LUMPECTOMY WITH RADIOACTIVE SEED AND SENTINEL LYMPH NODE BIOPSY Left 03/01/2024   Procedure: LEFT BREAST LUMPECTOMY WITH RADIOACTIVE SEED AND SENTINEL LYMPH NODE BIOPSY;  Surgeon: Vanderbilt Ned, MD;  Location: [facility];  Service: General;  Laterality: Left;  LEFT BREAST SEED LUMPECTOMY LEFT AXILLARY SENTINEL LYMPH NODE MAPPING   CESAREAN SECTION     COLONOSCOPY  03/29/2021   2003   TONSILECTOMY, ADENOIDECTOMY, BILATERAL MYRINGOTOMY AND TUBES     Patient Active Problem List   Diagnosis Date Noted   Genetic testing 02/15/2024   Malignant neoplasm of upper-outer quadrant of left breast in female, estrogen receptor positive (HCC) 02/01/2024    REFERRING PROVIDER: Dr. Ned Vanderbilt  REFERRING DIAG: Lt breast cancer  THERAPY DIAG:  Abnormal posture  Malignant neoplasm of upper-outer quadrant of left breast in  female, estrogen receptor positive (HCC)  Aftercare following surgery for neoplasm  At risk for lymphedema  Rationale for Evaluation and Treatment: Rehabilitation  ONSET DATE: 01/08/24  SUBJECTIVE:                                                                                                                                                                                           SUBJECTIVE STATEMENT: I need to know what I can't do and can do.  If I do something quick I  really notice it.  No hx of shoulder problems.  Starts radiation next week.  I think I feel a bit swollen.  I am not using the compression  PERTINENT HISTORY:  Post Left lumpectomy with SLNB 03/01/24. 7 negative lymph nodes removed.  Patient was diagnosed on 01/08/2024 with left grade 2 invasive ductal carcinoma breast cancer. It measures 8 mm and is located in the upper outer quadrant. It is ER/PR positive and HER2 negative with a Ki67 of 2%. She has a history of Graves disease treated with oral radiation. She has also been diagnosed with ankylosing spondylosis in 2023.   PATIENT GOALS:  Reassess how my recovery is going related to arm function, pain, and swelling.  PAIN:  Are you having pain? Yes: NPRS scale: intermittent pain in the breast  Pain location: left breast  Pain description: sharp stabbing  Aggravating factors: nothing Relieving factors: nothing    PRECAUTIONS: Recent Surgery, left UE Lymphedema risk  RED FLAGS: None   ACTIVITY LEVEL / LEISURE: back to walking.  I haven't gone back to weight lifting yet.  Which I do everyday.     OBJECTIVE:   PATIENT SURVEYS:  QUICK DASH: 20.45% From 0%  OBSERVATIONS: Well healed incisions, axillary incision with some puckering and rolling but healed well.  POSTURE:  WNL  LYMPHEDEMA ASSESSMENT:   UPPER EXTREMITY AROM/PROM:   A/PROM RIGHT   eval    Shoulder extension 47  Shoulder flexion 147  Shoulder abduction 163  Shoulder internal rotation 56   Shoulder external rotation 90                          (Blank rows = not tested)   A/PROM LEFT   eval 03/21/24  Shoulder extension 52 70  Shoulder flexion 141 155  Shoulder abduction 162 165  Shoulder internal rotation 51 70  Shoulder external rotation 88 95                          (Blank rows = not tested)   CERVICAL AROM: All within normal limits   UPPER EXTREMITY STRENGTH: WNL   LYMPHEDEMA ASSESSMENTS (in cm):    LANDMARK RIGHT   eval  10 cm proximal to olecranon process 33  Olecranon process 25.5  10 cm proximal to ulnar styloid process 24.6  Just proximal to ulnar styloid process 16.1  Across hand at thumb web space 18.7  At base of 2nd digit 6.2  (Blank rows = not tested)   LANDMARK LEFT   eval  10 cm proximal to olecranon process 33.1  Olecranon process 25.1  10 cm proximal to ulnar styloid process 23.4  Just proximal to ulnar styloid process 15.7  Across hand at thumb web space 18.2  At base of 2nd digit 6.2  (Blank rows = not tested)  PATIENT EDUCATION:  Education details: per post op instruction below.  Focused on return to gym education and weight lifting, when to get a sleeve, what to watch for, and going slow and low at first.   Person educated: Patient Education method: Explanation and Handouts Education comprehension: verbalized understanding  HOME EXERCISE PROGRAM: Reviewed previously given post op HEP. And ABC video exercises   ASSESSMENT:  CLINICAL IMPRESSION: Pt is doing very well post lumpectomy.  Her ROM is more than baseline.  She had a hard time with compression and her incision location so she was encouraged to try this again  now that she is healed.  Her main question is how to get back to weight lifting.    Pt will benefit from skilled therapeutic intervention to improve on the following deficits: Decreased knowledge of precautions, impaired UE functional use, pain, decreased ROM, postural dysfunction.   PT treatment/interventions:  ADL/Self care home management, [phone]- Self Care   GOALS: Goals reviewed with patient? Yes  LONG TERM GOALS:  (STG=LTG)  GOALS Name Target Date  Goal status  1 Pt will demonstrate she has regained full shoulder ROM and function post operatively compared to baselines.  Baseline: 03/21/24 INITIAL                    PLAN:  PT FREQUENCY/DURATION: SOZO only  PLAN FOR NEXT SESSION: SOZO only    [facility]  [address], [address]  [place] [address]  [phone]  After Breast Cancer Class Video It is recommended you view the ABC class video to be educated on lymphedema risk reduction. This video lasts for about 30 minutes. It can be viewed on our website here: [email or web address]  Scar massage You can begin gentle scar massage to you incision sites. Gently place one hand on the incision and move the skin (without sliding on the skin) in various directions. Do this for a few minutes and then you can gently massage either coconut oil or vitamin E cream into the scars.  Compression garment You should continue wearing your compression bra until you feel like you no longer have swelling.  Home exercise Program Continue doing the exercises you were given until you feel like you can do them without feeling any tightness at the end.   Walking Program Studies show that 30 minutes of walking per day (fast enough to elevate your heart rate) can significantly reduce the risk of a cancer recurrence. If you can't walk due to other medical reasons, we encourage you to find another activity you could do (like a stationary bike or water exercise).  Posture After breast cancer surgery, people frequently sit with rounded shoulders posture because it puts their incisions on slack and feels better. If you sit like this and scar tissue forms in that position, you can become very tight and have pain sitting  or standing with good posture. Try to be aware of your posture and sit and stand up tall to heal properly.  Follow up PT: It is recommended you return every 3 months for the first 3 years following surgery to be assessed on the SOZO machine for an L-Dex score. This helps prevent clinically significant lymphedema in 95% of patients. These follow up screens are 10 minute appointments that you are not billed for.  Larue Saddie SAUNDERS, PT 03/21/2024, 8:38 AM  PHYSICAL THERAPY DISCHARGE SUMMARY  Visits from Start of Care: 2  Current functional level related to goals / functional outcomes: See above   Remaining deficits: Lymphedema risk    Education / Equipment: Self care plan  Plan: Patient agrees to discharge.   Patient is being discharged due to meeting the stated rehab goals.

## 2024-03-21 ENCOUNTER — Inpatient Hospital Stay: Attending: Hematology and Oncology | Admitting: Hematology and Oncology

## 2024-03-21 ENCOUNTER — Encounter: Payer: Self-pay | Admitting: Rehabilitation

## 2024-03-21 ENCOUNTER — Ambulatory Visit: Attending: Surgery | Admitting: Rehabilitation

## 2024-03-21 VITALS — BP 125/85 | HR 71 | Temp 98.7°F | Resp 16 | Ht 63.0 in | Wt 166.4 lb

## 2024-03-21 DIAGNOSIS — C50412 Malignant neoplasm of upper-outer quadrant of left female breast: Secondary | ICD-10-CM | POA: Diagnosis not present

## 2024-03-21 DIAGNOSIS — Z170 Estrogen receptor positive status [ER+]: Secondary | ICD-10-CM | POA: Insufficient documentation

## 2024-03-21 DIAGNOSIS — Z9189 Other specified personal risk factors, not elsewhere classified: Secondary | ICD-10-CM | POA: Insufficient documentation

## 2024-03-21 DIAGNOSIS — R293 Abnormal posture: Secondary | ICD-10-CM | POA: Diagnosis present

## 2024-03-21 DIAGNOSIS — Z483 Aftercare following surgery for neoplasm: Secondary | ICD-10-CM | POA: Insufficient documentation

## 2024-03-21 NOTE — Progress Notes (Signed)
 Patient Care Team: Clarice Nottingham, MD as PCP - General (Internal Medicine) Odean Potts, MD as Consulting Physician (Hematology and Oncology) Vanderbilt Ned, MD as Consulting Physician (General Surgery) Dewey Rush, MD as Consulting Physician (Radiation Oncology) Glean Stephane BROCKS, RN (Inactive) as Oncology Nurse Navigator Tyree Nanetta SAILOR, RN as Oncology Nurse Navigator  DIAGNOSIS:  Encounter Diagnosis  Name Primary?   Malignant neoplasm of upper-outer quadrant of left breast in female, estrogen receptor positive (HCC) Yes    SUMMARY OF ONCOLOGIC HISTORY: Oncology History  Malignant neoplasm of upper-outer quadrant of left breast in female, estrogen receptor positive (HCC)  01/28/2024 Initial Diagnosis   Screening mammogram detected left breast mass at 3 o'clock position 0.8 cm: Biopsy: Grade 2 IDC ER 100%, PR 100%, Ki67 2%, HER2 equivocal by IHC FISH negative, posterior left breast calcifications 1.7 cm: Biopsy: Benign concordant, axilla negative   02/03/2024 Cancer Staging   Staging form: Breast, AJCC 8th Edition - Clinical: Stage IA (cT1b, cN0, cM0, G2, ER+, PR+, HER2-) - Signed by Odean Potts, MD on 02/03/2024 Stage prefix: Initial diagnosis Histologic grading system: 3 grade system    Genetic Testing   [facility] CancerNext-Expanded Panel was Negative. 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 Report date is 02/22/2024.  The CancerNext-Expanded gene panel offered by [facility] and includes sequencing and rearrangement for the following 77 genes: AIP, ALK, APC, ATM, AXIN2, BAP1, BARD1, BMPR1A, BRCA1, BRCA2, BRIP1, CDC73, CDH1, CDK4, CDKN1B, CDKN2A, CEBPA, CHEK2, CTNNA1, DDX41, DICER1, ETV6, FH, FLCN, GATA2, LZTR1, MAX, MBD4, MEN1, MET, MLH1, MSH2, MSH3, MSH6, MUTYH, NF1, NF2, NTHL1, PALB2, PHOX2B, PMS2, POT1, PRKAR1A, PTCH1, PTEN, RAD51C, RAD51D, RB1, RET, RPS20, RUNX1, SDHA, SDHAF2, SDHB, SDHC, SDHD, SMAD4, SMARCA4, SMARCB1, SMARCE1, STK11,  SUFU, TMEM127, TP53, TSC1, TSC2, VHL, and WT1 (sequencing and deletion/duplication); EGFR, HOXB13, KIT, MITF, PDGFRA, POLD1, and POLE (sequencing only); EPCAM and GREM1 (deletion/duplication only).    03/01/2024 Surgery   Left lumpectomy: Grade 2 IDC 1 cm, margins negative, LVI not identified, 0/7 lymph nodes negative, ER 100%, PR 100%, HER2 negative, Ki67 2%     CHIEF COMPLIANT: Follow-up after lumpectomy to discuss results  HISTORY OF PRESENT ILLNESS:  History of Present Illness Jacqueline Mayer is a 58 year old female with breast cancer who presents for post-surgical follow-up.  She underwent surgery for breast cancer, with pathology confirming a one-centimeter tumor that was estrogen and progesterone receptor-positive, HER2-negative. All lymph nodes and surgical margins were negative, indicating complete removal of cancerous tissue. Her Oncotype DX score is seven, indicating a low risk of distant recurrence. She is postmenopausal. She experiences soreness in her arm in unusual areas, which she considers expected post-surgery.     ALLERGIES:  is allergic to bactrim [sulfamethoxazole-trimethoprim], bee venom, and sulfa antibiotics.  MEDICATIONS:  Current Outpatient Medications  Medication Sig Dispense Refill   ALPRAZolam (XANAX) 0.25 MG tablet Take 0.25 mg by mouth daily as needed.     atorvastatin (LIPITOR) 20 MG tablet Take 1 tablet by mouth daily.     bisoprolol-hydrochlorothiazide (ZIAC) 10-6.25 MG tablet Take 1 tablet by mouth daily.     EPINEPHrine  0.3 mg/0.3 mL IJ SOAJ injection Inject 0.3 mg into the muscle as needed for anaphylaxis. 1 each 1   ezetimibe (ZETIA) 10 MG tablet Take 1 tablet by mouth daily.     ibuprofen  (ADVIL ) 800 MG tablet Take 1 tablet (800 mg total) by mouth every 8 (eight) hours as needed. 30 tablet 0   levothyroxine (  SYNTHROID) 88 MCG tablet Take 75 mcg by mouth every morning.     oxyCODONE  (OXY IR/ROXICODONE ) 5 MG immediate release tablet Take 1 tablet (5 mg  total) by mouth every 6 (six) hours as needed for severe pain (pain score 7-10). 15 tablet 0   tirzepatide (MOUNJARO) 7.5 MG/0.5ML Pen Inject 7.5 mg into the skin once a week.     traZODone (DESYREL) 50 MG tablet Take 50 mg by mouth at bedtime.     aspirin  EC 81 MG tablet Take 1 tablet (81 mg total) by mouth daily. Swallow whole. (Patient not taking: Reported on 03/21/2024) 90 tablet 3   Current Facility-Administered Medications  Medication Dose Route Frequency Provider Last Rate Last Admin   0.9 %  sodium chloride  infusion  500 mL Intravenous Once Eda Iha, MD        PHYSICAL EXAMINATION: ECOG PERFORMANCE STATUS: 1 - Symptomatic but completely ambulatory  Vitals:   03/21/24 1146  BP: 125/85  Pulse: 71  Resp: 16  Temp: 98.7 F (37.1 C)  SpO2: 99%   Filed Weights   03/21/24 1146  Weight: 166 lb 6.4 oz (75.5 kg)    Physical Exam   (exam performed in the presence of a chaperone)  LABORATORY DATA:  I have reviewed the data as listed    Latest Ref Rng & Units 02/03/2024   12:13 PM  CMP  Glucose 70 - 99 mg/dL 97   BUN 6 - 20 mg/dL 15   Creatinine 9.55 - 1.00 mg/dL 9.16   Sodium 864 - 854 mmol/L 139   Potassium 3.5 - 5.1 mmol/L 3.9   Chloride 98 - 111 mmol/L 104   CO2 22 - 32 mmol/L 28   Calcium  8.9 - 10.3 mg/dL 89.9   Total Protein 6.5 - 8.1 g/dL 8.1   Total Bilirubin 0.0 - 1.2 mg/dL 0.7   Alkaline Phos 38 - 126 U/L 56   AST 15 - 41 U/L 19   ALT 0 - 44 U/L 18     Lab Results  Component Value Date   WBC 8.6 02/03/2024   HGB 13.8 02/03/2024   HCT 40.4 02/03/2024   MCV 87.8 02/03/2024   PLT 234 02/03/2024   NEUTROABS 5.8 02/03/2024    ASSESSMENT & PLAN:  Malignant neoplasm of upper-outer quadrant of left breast in female, estrogen receptor positive (HCC) 01/28/2024:Screening mammogram detected left breast mass at 3 o'clock position 0.8 cm: Biopsy: Grade 2 IDC ER 100%, PR 100%, Ki67 2%, HER2 equivocal by IHC FISH negative, posterior left breast  calcifications 1.7 cm: Biopsy: Benign concordant, axilla negative    Recommendations: 1.  03/01/2024:Left lumpectomy: Grade 2 IDC 1 cm, margins negative, LVI not identified, 0/7 lymph nodes negative, ER 100%, PR 100%, HER2 negative, Ki67 2%  2. Oncotype DX: Score 6 (risk of distant recurrence 3%) 3. Adjuvant radiation therapy followed by 4. Adjuvant antiestrogen therapy ------------------------------------- Assessment and Plan Assessment & Plan Malignant neoplasm of upper-outer quadrant of left breast, estrogen receptor positive Pathology confirmed a 1 cm grade 2 tumor with negative margins, no lymphovascular invasion, and negative lymph nodes. Estrogen and progesterone receptor positive, HER2 negative, stage 1A. Oncotype DX score 7, indicating low risk of distant recurrence with 3% risk over 10 years with hormone therapy. Chemotherapy not indicated. - Proceed with radiation therapy. - Initiate anastrozole therapy post-radiation, as she is postmenopausal.  Return to clinic after radiation is completed    No orders of the defined types were placed in this encounter.  The patient has a good understanding of the overall plan. she agrees with it. she will call with any problems that may develop before the next visit here. Total time spent: 30 mins including face to face time and time spent for planning, charting and co-ordination of care   Naomi MARLA Chad, MD 03/21/24

## 2024-03-24 ENCOUNTER — Encounter: Payer: Self-pay | Admitting: *Deleted

## 2024-03-28 NOTE — Progress Notes (Signed)
 Radiation Oncology         [phone]) [phone] ________________________________  Name: Jacqueline Mayer        MRN: [id number]  Date of Service: 03/29/2024 DOB: 02-Dec-1965  RR:Eyjmm, Ryan, MD  Odean Potts, MD     REFERRING PHYSICIAN: Odean Potts, MD  DIAGNOSIS: The encounter diagnosis was Malignant neoplasm of upper-outer quadrant of left breast in female, estrogen receptor positive (HCC).   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 female originally seen in the multidisciplinary breast clinic for a new diagnosis of left breast cancer. The patient was noted to have asymmetry on screening mammogram. She proceeded with diagnostic mammogram and ultrasound on 01/21/2024 that showed a 0.8 cm mass at the 3:00 position, and a 3 mm group of microcalcifications immediately anterior to the mass.  No abnormalities in the left axilla were noted. Accordingly, patient underwent a biopsies of the areas of concern. Pathology from the mass in the 3:00 position revealed grade 2 invasive ductal carcinoma that was ER and PR positive and HER2 negative with a Ki-67 2%; pathology from the area of calcifications was negative for atypia or malignancy.  Since her last visit, she underwent a left lumpectomy and sentinel node biopsy with Dr. Vanderbilt on 03/01/24. Final pathology showed a grade 2 invasive ductal carcinoma measuring 1 cm. The margins were all clear, and 7 nodes were examined and negative for disease. An Oncotype Dx showed a recurrence score of 7 and Dr. Odean recommends antiestrogen therapy but no chemotherapy. She's seen today to discuss adjuvant radiotherapy.   PREVIOUS RADIATION THERAPY: Yes  Radioactive thyroid  ablation in ~2007   PAST MEDICAL HISTORY:  Past Medical History:  Diagnosis Date   Breast cancer (HCC)    left breast IDC   Hyperlipidemia    Hypertension    Pre-diabetes     Thyroid  disease        PAST SURGICAL HISTORY: Past Surgical History:  Procedure Laterality Date   BREAST BIOPSY Left 01/22/2024   US  LT BREAST BX W LOC DEV 1ST LESION IMG BX SPEC US  GUIDE 01/22/2024 [facility]   BREAST BIOPSY Left 01/28/2024   MM LT BREAST BX W LOC DEV 1ST LESION IMAGE BX SPEC STEREO GUIDE 01/28/2024 [facility]   BREAST BIOPSY  02/26/2024   MM LT RADIOACTIVE SEED LOC MAMMO GUIDE 02/26/2024 [facility]   BREAST LUMPECTOMY WITH RADIOACTIVE SEED AND SENTINEL LYMPH NODE BIOPSY Left 03/01/2024   Procedure: LEFT BREAST LUMPECTOMY WITH RADIOACTIVE SEED AND SENTINEL LYMPH NODE BIOPSY;  Surgeon: Vanderbilt Ned, MD;  Location: [facility];  Service: General;  Laterality: Left;  LEFT BREAST SEED LUMPECTOMY LEFT AXILLARY SENTINEL LYMPH NODE MAPPING   CESAREAN SECTION     COLONOSCOPY  03/29/2021   2003   TONSILECTOMY, ADENOIDECTOMY, BILATERAL MYRINGOTOMY AND TUBES       FAMILY HISTORY:  Family History  Problem Relation Age of Onset   Colon polyps Mother    Heart attack Mother    Diabetes Mother    Colon cancer Mother 67       dx. liver cancer at the same time, unsure which was the primary   Cervical cancer Mother 60   Colon polyps Father    Diabetes Father    Cervical cancer Maternal Aunt 34   Cervical cancer Maternal Grandmother 34   Esophageal cancer Neg Hx    Rectal  cancer Neg Hx    Stomach cancer Neg Hx      SOCIAL HISTORY:  reports that she has never smoked. She has never used smokeless tobacco. She reports current alcohol use of about 3.0 standard drinks of alcohol per week. She reports that she does not use drugs. The patient is married and lives in [place]. She works at [employer]   ALLERGIES: Bactrim [sulfamethoxazole-trimethoprim], Bee venom, and Sulfa antibiotics   MEDICATIONS:  Current Outpatient Medications  Medication Sig Dispense Refill   ALPRAZolam (XANAX) 0.25 MG tablet Take 0.25 mg by mouth daily as needed.      aspirin  EC 81 MG tablet Take 1 tablet (81 mg total) by mouth daily. Swallow whole. (Patient not taking: Reported on 03/21/2024) 90 tablet 3   atorvastatin (LIPITOR) 20 MG tablet Take 1 tablet by mouth daily.     bisoprolol-hydrochlorothiazide (ZIAC) 10-6.25 MG tablet Take 1 tablet by mouth daily.     EPINEPHrine  0.3 mg/0.3 mL IJ SOAJ injection Inject 0.3 mg into the muscle as needed for anaphylaxis. 1 each 1   ezetimibe (ZETIA) 10 MG tablet Take 1 tablet by mouth daily.     ibuprofen  (ADVIL ) 800 MG tablet Take 1 tablet (800 mg total) by mouth every 8 (eight) hours as needed. 30 tablet 0   levothyroxine (SYNTHROID) 88 MCG tablet Take 75 mcg by mouth every morning.     oxyCODONE  (OXY IR/ROXICODONE ) 5 MG immediate release tablet Take 1 tablet (5 mg total) by mouth every 6 (six) hours as needed for severe pain (pain score 7-10). 15 tablet 0   tirzepatide (MOUNJARO) 7.5 MG/0.5ML Pen Inject 7.5 mg into the skin once a week.     traZODone (DESYREL) 50 MG tablet Take 50 mg by mouth at bedtime.     Current Facility-Administered Medications  Medication Dose Route Frequency Provider Last Rate Last Admin   0.9 %  sodium chloride  infusion  500 mL Intravenous Once Beavers, Kimberly, MD         REVIEW OF SYSTEMS: On review of systems, the patient reports that she ***  PHYSICAL EXAM:  Wt Readings from Last 3 Encounters:  03/21/24 166 lb 6.4 oz (75.5 kg)  03/01/24 168 lb 10.4 oz (76.5 kg)  02/03/24 155 lb 6.4 oz (70.5 kg)   Temp Readings from Last 3 Encounters:  03/21/24 98.7 F (37.1 C) (Oral)  03/01/24 98.2 F (36.8 C)  02/03/24 97.6 F (36.4 C) (Temporal)   BP Readings from Last 3 Encounters:  03/21/24 125/85  03/01/24 123/75  02/03/24 118/80   Pulse Readings from Last 3 Encounters:  03/21/24 71  03/01/24 67  02/03/24 72    In general this is a well appearing female in no acute distress. She's alert and oriented x4 and appropriate throughout the examination. Cardiopulmonary assessment is  negative for acute distress and she exhibits normal effort. The left breast and axillary incision sites are well healed without erythema, separation, or drainage.    ECOG = 0  0 - Asymptomatic (Fully active, able to carry on all predisease activities without restriction)  1 - Symptomatic but completely ambulatory (Restricted in physically strenuous activity but ambulatory and able to carry out work of a light or sedentary nature. For example, light housework, office work)  2 - Symptomatic, <50% in bed during the day (Ambulatory and capable of all self care but unable to carry out any work activities. Up and about more than 50% of waking hours)  3 - Symptomatic, >50% in bed,  but not bedbound (Capable of only limited self-care, confined to bed or chair 50% or more of waking hours)  4 - Bedbound (Completely disabled. Cannot carry on any self-care. Totally confined to bed or chair)  5 - Death   Raylene MM, Creech RH, Tormey DC, et al. [phone]). Toxicity and response criteria of the [facility]. Am. DOROTHA Bridges. Oncol. 5 (6): 649-55    LABORATORY DATA:  Lab Results  Component Value Date   WBC 8.6 02/03/2024   HGB 13.8 02/03/2024   HCT 40.4 02/03/2024   MCV 87.8 02/03/2024   PLT 234 02/03/2024   Lab Results  Component Value Date   NA 139 02/03/2024   K 3.9 02/03/2024   CL 104 02/03/2024   CO2 28 02/03/2024   Lab Results  Component Value Date   ALT 18 02/03/2024   AST 19 02/03/2024   ALKPHOS 56 02/03/2024   BILITOT 0.7 02/03/2024      RADIOGRAPHY: MM Breast Surgical Specimen Result Date: 03/01/2024 CLINICAL DATA:  Post excision of a left breast lesion following radioactive seed localization. Assess surgical specimen. EXAM: SPECIMEN RADIOGRAPH OF THE LEFT BREAST COMPARISON:  Previous exam(s). FINDINGS: Status post excision of the left breast. The radioactive seed and heart shaped post biopsy marker clip are present, completely intact, and were marked for  pathology. The X shaped post biopsy marker clip from a benign stereotactic biopsy is included within the specimen. IMPRESSION: Specimen radiograph of the left breast. Electronically Signed   By: Alm Parkins M.D.   On: 03/01/2024 15:27       IMPRESSION/PLAN: 1. Stage IA, pT1bN0M0, grade 2, ER/PR positive invasive ductal carcinoma of the left breast. Dr. Dewey has reviewed the final pathology findings and today we reviewed the nature of stage breast cancer. She has done well since surgery and Dr. Odean does not recommend chemotherapy based on her Oncotype Dx results. Dr. Dewey recommends external beam radiotherapy to the breast following her lumpectomy to reduce risks of local recurrence. Patient understands that if chemotherapy would precede radiation, if recommended. We discussed the risks, benefits, short, and long term effects of radiotherapy, as well as the curative intent, and the patient is interested in proceeding. We discussed the delivery and logistics of radiotherapy and Dr. Dewey recommends 4 weeks of radiotherapy to the left breast with deep inspiration breath hold technique. Written consent is obtained and placed in the chart, a copy was provided to the patient. She will simulate today.     In a visit lasting *** minutes, greater than 50% of the time was spent face to face reviewing her case, as well as in preparation of, discussing, and coordinating the patient's care.      Donald KYM Husband, [facility]     **Disclaimer: This note was dictated with voice recognition software. Similar sounding words can inadvertently be transcribed and this note may contain transcription errors which may not have been corrected upon publication of note.**

## 2024-03-29 ENCOUNTER — Encounter: Payer: Self-pay | Admitting: Radiation Oncology

## 2024-03-29 ENCOUNTER — Ambulatory Visit
Admission: RE | Admit: 2024-03-29 | Discharge: 2024-03-29 | Disposition: A | Discharge status: Home / Self Care | Source: Ambulatory Visit | Attending: Radiation Oncology | Admitting: Radiation Oncology

## 2024-03-29 VITALS — BP 114/86 | HR 63 | Temp 97.2°F | Resp 18 | Ht 63.0 in | Wt 165.0 lb

## 2024-03-29 DIAGNOSIS — Z79899 Other long term (current) drug therapy: Secondary | ICD-10-CM | POA: Insufficient documentation

## 2024-03-29 DIAGNOSIS — I10 Essential (primary) hypertension: Secondary | ICD-10-CM | POA: Insufficient documentation

## 2024-03-29 DIAGNOSIS — Z170 Estrogen receptor positive status [ER+]: Secondary | ICD-10-CM | POA: Insufficient documentation

## 2024-03-29 DIAGNOSIS — Z1721 Progesterone receptor positive status: Secondary | ICD-10-CM | POA: Insufficient documentation

## 2024-03-29 DIAGNOSIS — Z923 Personal history of irradiation: Secondary | ICD-10-CM | POA: Insufficient documentation

## 2024-03-29 DIAGNOSIS — E785 Hyperlipidemia, unspecified: Secondary | ICD-10-CM | POA: Insufficient documentation

## 2024-03-29 DIAGNOSIS — Z79890 Hormone replacement therapy (postmenopausal): Secondary | ICD-10-CM | POA: Insufficient documentation

## 2024-03-29 DIAGNOSIS — Z8049 Family history of malignant neoplasm of other genital organs: Secondary | ICD-10-CM | POA: Insufficient documentation

## 2024-03-29 DIAGNOSIS — E079 Disorder of thyroid, unspecified: Secondary | ICD-10-CM | POA: Insufficient documentation

## 2024-03-29 DIAGNOSIS — Z1732 Human epidermal growth factor receptor 2 negative status: Secondary | ICD-10-CM | POA: Insufficient documentation

## 2024-03-29 DIAGNOSIS — Z7982 Long term (current) use of aspirin: Secondary | ICD-10-CM | POA: Insufficient documentation

## 2024-03-29 DIAGNOSIS — Z800 Family history of malignant neoplasm of digestive organs: Secondary | ICD-10-CM | POA: Insufficient documentation

## 2024-03-29 DIAGNOSIS — C50412 Malignant neoplasm of upper-outer quadrant of left female breast: Secondary | ICD-10-CM | POA: Insufficient documentation

## 2024-03-29 DIAGNOSIS — R21 Rash and other nonspecific skin eruption: Secondary | ICD-10-CM | POA: Insufficient documentation

## 2024-03-29 MED ORDER — NYSTATIN 100000 UNIT/GM EX CREA: 1.0000 | TOPICAL_CREAM | Freq: Three times a day (TID) | CUTANEOUS | 1 refills | Status: AC

## 2024-03-29 NOTE — Progress Notes (Signed)
 Location of Breast Cancer: Left breast  Histology per Pathology Report:  FINAL MICROSCOPIC DIAGNOSIS:  A. BREAST, LEFT, LUMPECTOMY:  - Invasive ductal carcinoma, 1 cm, grade 2  - Ductal carcinoma in situ: Not identified  - Margins, invasive: All margins negative for invasive carcinoma      Closest, invasive: 0.4 cm, anterior  - Margins, DCIS: All margins negative for ductal carcinoma in situ      Closest, DCIS: Not applicable  - Lymphovascular invasion: Not identified  - Prognostic markers:  ER 100%, positive, strong staining intensity, PR  100%, positive, strong staining intensity, Her2 negative, Ki-67 2%  - Other: Biopsy site changes present  - See oncology table   Receptor Status: ER 100%, positive, PR 100%, positive, Her2 negative, Ki-67 2%   Did patient present with symptoms (if so, please note symptoms) or was this found on screening mammography?:  The patient was noted to have asymmetry on screening mammogram.  Past/Anticipated interventions by surgeon, if any: Vanderbilt Ned, MD  03/01/2024 Procedure Laterality Anesthesia  LEFT BREAST LUMPECTOMY WITH RADIOACTIVE SEED AND SENTINEL LYMPH NODE BIOPSY Left General  LEFT BREAST SEED LUMPECTOMY LEFT AXILLARY SENTINEL LYMPH NODE MAPPING      Past/Anticipated interventions by medical oncology, if any:  ASSESSMENT & PLAN:  Malignant neoplasm of upper-outer quadrant of left breast in female, estrogen receptor positive (HCC) 01/28/2024:Screening mammogram detected left breast mass at 3 o'clock position 0.8 cm: Biopsy: Grade 2 IDC ER 100%, PR 100%, Ki67 2%, HER2 equivocal by IHC FISH negative, posterior left breast calcifications 1.7 cm: Biopsy: Benign concordant, axilla negative   Recommendations: 1.  03/01/2024:Left lumpectomy: Grade 2 IDC 1 cm, margins negative, LVI not identified, 0/7 lymph nodes negative, ER 100%, PR 100%, HER2 negative, Ki67 2%  2. Oncotype DX: Score 6 (risk of distant recurrence 3%) 3. Adjuvant radiation therapy  followed by 4. Adjuvant antiestrogen therapy ------------------------------------- Assessment and Plan Malignant neoplasm of upper-outer quadrant of left breast, estrogen receptor positive Pathology confirmed a 1 cm grade 2 tumor with negative margins, no lymphovascular invasion, and negative lymph nodes. Estrogen and progesterone receptor positive, HER2 negative, stage 1A. Oncotype DX score 7, indicating low risk of distant recurrence with 3% risk over 10 years with hormone therapy. Chemotherapy not indicated. - Proceed with radiation therapy. - Initiate anastrozole therapy post-radiation, as she is postmenopausal. -Return to clinic after radiation is completed  Chemotherapy- None indicated at this time.  Lymphedema issues, if any: No lymphovascular invasion, and negative lymph nodes.   Pain issues, if any:  Tenderness at incision line LT breast 1/10  Skin issues if any: Rash/ redness/itching inferior to incision line LT breast.   SAFETY ISSUES: Prior radiation?  Yes- Radioactive thyroid  ablation in ~2007  Pacemaker/ICD? No Possible current pregnancy? No- Postmenopausal Is the patient on methotrexate? no  Current Complaints / other details: Possible cording LT arm. ROM good at 100%.  Vitals: BP 114/86 (BP Location: Right Arm, Patient Position: Sitting, Cuff Size: Normal)   Pulse 63   Temp (!) 97.2 F (36.2 C) (Temporal)   Resp 18   Ht 5' 3 (1.6 m)   Wt 165 lb (74.8 kg)   LMP 05/31/2015   SpO2 97%   BMI 29.23 kg/m     This concludes the interaction.  Rosaline Minerva, LPN

## 2024-03-30 ENCOUNTER — Encounter: Payer: Self-pay | Admitting: Radiation Oncology

## 2024-04-04 ENCOUNTER — Encounter: Payer: Self-pay | Admitting: *Deleted

## 2024-04-04 DIAGNOSIS — Z170 Estrogen receptor positive status [ER+]: Secondary | ICD-10-CM

## 2024-04-05 ENCOUNTER — Encounter: Payer: Self-pay | Admitting: Radiation Oncology

## 2024-04-06 ENCOUNTER — Ambulatory Visit
Admission: RE | Admit: 2024-04-06 | Discharge: 2024-04-06 | Disposition: A | Discharge status: Home / Self Care | Source: Ambulatory Visit | Attending: Radiation Oncology | Admitting: Radiation Oncology

## 2024-04-06 DIAGNOSIS — Z170 Estrogen receptor positive status [ER+]: Secondary | ICD-10-CM | POA: Insufficient documentation

## 2024-04-06 DIAGNOSIS — C50412 Malignant neoplasm of upper-outer quadrant of left female breast: Secondary | ICD-10-CM | POA: Insufficient documentation

## 2024-04-12 ENCOUNTER — Other Ambulatory Visit: Payer: Self-pay

## 2024-04-12 DIAGNOSIS — C50412 Malignant neoplasm of upper-outer quadrant of left female breast: Secondary | ICD-10-CM | POA: Diagnosis not present

## 2024-04-12 LAB — RAD ONC ARIA SESSION SUMMARY
Course Elapsed Days: 0
Plan Fractions Treated to Date: 1
Plan Prescribed Dose Per Fraction: 2.66 Gy
Plan Total Fractions Prescribed: 16
Plan Total Prescribed Dose: 42.56 Gy
Reference Point Dosage Given to Date: 2.66 Gy
Reference Point Session Dosage Given: 2.66 Gy
Session Number: 1

## 2024-04-13 ENCOUNTER — Other Ambulatory Visit: Payer: Self-pay

## 2024-04-13 ENCOUNTER — Ambulatory Visit
Admission: RE | Admit: 2024-04-13 | Discharge: 2024-04-13 | Disposition: A | Discharge status: Home / Self Care | Source: Ambulatory Visit | Attending: Radiation Oncology | Admitting: Radiation Oncology

## 2024-04-13 DIAGNOSIS — C50412 Malignant neoplasm of upper-outer quadrant of left female breast: Secondary | ICD-10-CM | POA: Diagnosis not present

## 2024-04-13 LAB — RAD ONC ARIA SESSION SUMMARY
Course Elapsed Days: 1
Plan Fractions Treated to Date: 2
Plan Prescribed Dose Per Fraction: 2.66 Gy
Plan Total Fractions Prescribed: 16
Plan Total Prescribed Dose: 42.56 Gy
Reference Point Dosage Given to Date: 5.32 Gy
Reference Point Session Dosage Given: 2.66 Gy
Session Number: 2

## 2024-04-14 ENCOUNTER — Ambulatory Visit
Admission: RE | Admit: 2024-04-14 | Discharge: 2024-04-14 | Disposition: A | Discharge status: Home / Self Care | Source: Ambulatory Visit | Attending: Radiation Oncology | Admitting: Radiation Oncology

## 2024-04-14 ENCOUNTER — Other Ambulatory Visit: Payer: Self-pay

## 2024-04-14 DIAGNOSIS — C50412 Malignant neoplasm of upper-outer quadrant of left female breast: Secondary | ICD-10-CM | POA: Diagnosis not present

## 2024-04-14 LAB — RAD ONC ARIA SESSION SUMMARY
Course Elapsed Days: 2
Plan Fractions Treated to Date: 3
Plan Prescribed Dose Per Fraction: 2.66 Gy
Plan Total Fractions Prescribed: 16
Plan Total Prescribed Dose: 42.56 Gy
Reference Point Dosage Given to Date: 7.98 Gy
Reference Point Session Dosage Given: 2.66 Gy
Session Number: 3

## 2024-04-15 ENCOUNTER — Other Ambulatory Visit: Payer: Self-pay

## 2024-04-15 ENCOUNTER — Ambulatory Visit
Admission: RE | Admit: 2024-04-15 | Discharge: 2024-04-15 | Disposition: A | Discharge status: Home / Self Care | Source: Ambulatory Visit | Attending: Radiation Oncology | Admitting: Radiation Oncology

## 2024-04-15 DIAGNOSIS — C50412 Malignant neoplasm of upper-outer quadrant of left female breast: Secondary | ICD-10-CM | POA: Diagnosis not present

## 2024-04-15 DIAGNOSIS — Z170 Estrogen receptor positive status [ER+]: Secondary | ICD-10-CM

## 2024-04-15 LAB — RAD ONC ARIA SESSION SUMMARY
Course Elapsed Days: 3
Plan Fractions Treated to Date: 4
Plan Prescribed Dose Per Fraction: 2.66 Gy
Plan Total Fractions Prescribed: 16
Plan Total Prescribed Dose: 42.56 Gy
Reference Point Dosage Given to Date: 10.64 Gy
Reference Point Session Dosage Given: 2.66 Gy
Session Number: 4

## 2024-04-15 MED ORDER — RADIAPLEXRX EX GEL: Freq: Once | CUTANEOUS | Status: AC

## 2024-04-15 MED ORDER — ALRA NON-METALLIC DEODORANT (RAD-ONC)
1.0000 | Freq: Once | TOPICAL | Status: AC
  Administered 2024-04-15: 1 via TOPICAL

## 2024-04-18 ENCOUNTER — Ambulatory Visit
Admission: RE | Admit: 2024-04-18 | Discharge: 2024-04-18 | Disposition: A | Discharge status: Home / Self Care | Source: Ambulatory Visit | Attending: Radiation Oncology

## 2024-04-18 ENCOUNTER — Other Ambulatory Visit: Payer: Self-pay

## 2024-04-18 DIAGNOSIS — C50412 Malignant neoplasm of upper-outer quadrant of left female breast: Secondary | ICD-10-CM | POA: Diagnosis not present

## 2024-04-18 LAB — RAD ONC ARIA SESSION SUMMARY
Course Elapsed Days: 6
Plan Fractions Treated to Date: 5
Plan Prescribed Dose Per Fraction: 2.66 Gy
Plan Total Fractions Prescribed: 16
Plan Total Prescribed Dose: 42.56 Gy
Reference Point Dosage Given to Date: 13.3 Gy
Reference Point Session Dosage Given: 2.66 Gy
Session Number: 5

## 2024-04-19 ENCOUNTER — Other Ambulatory Visit: Payer: Self-pay

## 2024-04-19 ENCOUNTER — Ambulatory Visit
Admission: RE | Admit: 2024-04-19 | Discharge: 2024-04-19 | Disposition: A | Discharge status: Home / Self Care | Source: Ambulatory Visit | Attending: Radiation Oncology | Admitting: Radiation Oncology

## 2024-04-19 DIAGNOSIS — C50412 Malignant neoplasm of upper-outer quadrant of left female breast: Secondary | ICD-10-CM | POA: Diagnosis not present

## 2024-04-19 LAB — RAD ONC ARIA SESSION SUMMARY
Course Elapsed Days: 7
Plan Fractions Treated to Date: 6
Plan Prescribed Dose Per Fraction: 2.66 Gy
Plan Total Fractions Prescribed: 16
Plan Total Prescribed Dose: 42.56 Gy
Reference Point Dosage Given to Date: 15.96 Gy
Reference Point Session Dosage Given: 2.66 Gy
Session Number: 6

## 2024-04-20 ENCOUNTER — Other Ambulatory Visit: Payer: Self-pay

## 2024-04-20 ENCOUNTER — Ambulatory Visit
Admission: RE | Admit: 2024-04-20 | Discharge: 2024-04-20 | Disposition: A | Discharge status: Home / Self Care | Source: Ambulatory Visit | Attending: Radiation Oncology | Admitting: Radiation Oncology

## 2024-04-20 DIAGNOSIS — C50412 Malignant neoplasm of upper-outer quadrant of left female breast: Secondary | ICD-10-CM | POA: Diagnosis not present

## 2024-04-20 LAB — RAD ONC ARIA SESSION SUMMARY
Course Elapsed Days: 8
Plan Fractions Treated to Date: 7
Plan Prescribed Dose Per Fraction: 2.66 Gy
Plan Total Fractions Prescribed: 16
Plan Total Prescribed Dose: 42.56 Gy
Reference Point Dosage Given to Date: 18.62 Gy
Reference Point Session Dosage Given: 2.66 Gy
Session Number: 7

## 2024-04-21 ENCOUNTER — Other Ambulatory Visit: Payer: Self-pay

## 2024-04-21 ENCOUNTER — Ambulatory Visit
Admission: RE | Admit: 2024-04-21 | Discharge: 2024-04-21 | Discharge status: Home / Self Care | Source: Ambulatory Visit | Attending: Radiation Oncology

## 2024-04-21 DIAGNOSIS — C50412 Malignant neoplasm of upper-outer quadrant of left female breast: Secondary | ICD-10-CM | POA: Diagnosis not present

## 2024-04-21 LAB — RAD ONC ARIA SESSION SUMMARY
Course Elapsed Days: 9
Plan Fractions Treated to Date: 8
Plan Prescribed Dose Per Fraction: 2.66 Gy
Plan Total Fractions Prescribed: 16
Plan Total Prescribed Dose: 42.56 Gy
Reference Point Dosage Given to Date: 21.28 Gy
Reference Point Session Dosage Given: 2.66 Gy
Session Number: 8

## 2024-04-22 ENCOUNTER — Ambulatory Visit
Admission: RE | Admit: 2024-04-22 | Discharge: 2024-04-22 | Disposition: A | Discharge status: Home / Self Care | Source: Ambulatory Visit | Attending: Radiation Oncology | Admitting: Radiation Oncology

## 2024-04-22 ENCOUNTER — Other Ambulatory Visit: Payer: Self-pay

## 2024-04-22 DIAGNOSIS — C50412 Malignant neoplasm of upper-outer quadrant of left female breast: Secondary | ICD-10-CM | POA: Diagnosis not present

## 2024-04-22 LAB — RAD ONC ARIA SESSION SUMMARY
Course Elapsed Days: 10
Plan Fractions Treated to Date: 9
Plan Prescribed Dose Per Fraction: 2.66 Gy
Plan Total Fractions Prescribed: 16
Plan Total Prescribed Dose: 42.56 Gy
Reference Point Dosage Given to Date: 23.94 Gy
Reference Point Session Dosage Given: 2.66 Gy
Session Number: 9

## 2024-04-25 ENCOUNTER — Ambulatory Visit
Admission: RE | Admit: 2024-04-25 | Discharge: 2024-04-25 | Disposition: A | Discharge status: Home / Self Care | Source: Ambulatory Visit | Attending: Radiation Oncology | Admitting: Radiation Oncology

## 2024-04-25 ENCOUNTER — Other Ambulatory Visit: Payer: Self-pay

## 2024-04-25 DIAGNOSIS — C50412 Malignant neoplasm of upper-outer quadrant of left female breast: Secondary | ICD-10-CM | POA: Diagnosis not present

## 2024-04-25 LAB — RAD ONC ARIA SESSION SUMMARY
Course Elapsed Days: 13
Plan Fractions Treated to Date: 10
Plan Prescribed Dose Per Fraction: 2.66 Gy
Plan Total Fractions Prescribed: 16
Plan Total Prescribed Dose: 42.56 Gy
Reference Point Dosage Given to Date: 26.6 Gy
Reference Point Session Dosage Given: 2.66 Gy
Session Number: 10

## 2024-04-26 ENCOUNTER — Other Ambulatory Visit: Payer: Self-pay

## 2024-04-26 ENCOUNTER — Ambulatory Visit
Admission: RE | Admit: 2024-04-26 | Discharge: 2024-04-26 | Disposition: A | Discharge status: Home / Self Care | Source: Ambulatory Visit | Attending: Radiation Oncology | Admitting: Radiation Oncology

## 2024-04-26 DIAGNOSIS — C50412 Malignant neoplasm of upper-outer quadrant of left female breast: Secondary | ICD-10-CM | POA: Diagnosis not present

## 2024-04-26 LAB — RAD ONC ARIA SESSION SUMMARY
Course Elapsed Days: 14
Plan Fractions Treated to Date: 11
Plan Prescribed Dose Per Fraction: 2.66 Gy
Plan Total Fractions Prescribed: 16
Plan Total Prescribed Dose: 42.56 Gy
Reference Point Dosage Given to Date: 29.26 Gy
Reference Point Session Dosage Given: 2.66 Gy
Session Number: 11

## 2024-04-27 ENCOUNTER — Ambulatory Visit
Admission: RE | Admit: 2024-04-27 | Discharge: 2024-04-27 | Disposition: A | Discharge status: Home / Self Care | Source: Ambulatory Visit | Attending: Radiation Oncology | Admitting: Radiation Oncology

## 2024-04-27 ENCOUNTER — Other Ambulatory Visit: Payer: Self-pay

## 2024-04-27 DIAGNOSIS — C50412 Malignant neoplasm of upper-outer quadrant of left female breast: Secondary | ICD-10-CM | POA: Diagnosis not present

## 2024-04-27 LAB — RAD ONC ARIA SESSION SUMMARY
Course Elapsed Days: 15
Plan Fractions Treated to Date: 12
Plan Prescribed Dose Per Fraction: 2.66 Gy
Plan Total Fractions Prescribed: 16
Plan Total Prescribed Dose: 42.56 Gy
Reference Point Dosage Given to Date: 31.92 Gy
Reference Point Session Dosage Given: 2.66 Gy
Session Number: 12

## 2024-04-28 ENCOUNTER — Other Ambulatory Visit: Payer: Self-pay

## 2024-04-28 ENCOUNTER — Ambulatory Visit
Admission: RE | Admit: 2024-04-28 | Discharge: 2024-04-28 | Disposition: A | Discharge status: Home / Self Care | Source: Ambulatory Visit | Attending: Radiation Oncology

## 2024-04-28 DIAGNOSIS — C50412 Malignant neoplasm of upper-outer quadrant of left female breast: Secondary | ICD-10-CM | POA: Diagnosis not present

## 2024-04-28 LAB — RAD ONC ARIA SESSION SUMMARY
Course Elapsed Days: 16
Plan Fractions Treated to Date: 13
Plan Prescribed Dose Per Fraction: 2.66 Gy
Plan Total Fractions Prescribed: 16
Plan Total Prescribed Dose: 42.56 Gy
Reference Point Dosage Given to Date: 34.58 Gy
Reference Point Session Dosage Given: 2.66 Gy
Session Number: 13

## 2024-04-29 ENCOUNTER — Ambulatory Visit: Admitting: Radiation Oncology

## 2024-04-29 ENCOUNTER — Ambulatory Visit
Admission: RE | Admit: 2024-04-29 | Discharge: 2024-04-29 | Disposition: A | Discharge status: Home / Self Care | Source: Ambulatory Visit | Attending: Radiation Oncology | Admitting: Radiation Oncology

## 2024-04-29 ENCOUNTER — Other Ambulatory Visit: Payer: Self-pay

## 2024-04-29 DIAGNOSIS — C50412 Malignant neoplasm of upper-outer quadrant of left female breast: Secondary | ICD-10-CM | POA: Diagnosis not present

## 2024-04-29 LAB — RAD ONC ARIA SESSION SUMMARY
Course Elapsed Days: 17
Plan Fractions Treated to Date: 14
Plan Prescribed Dose Per Fraction: 2.66 Gy
Plan Total Fractions Prescribed: 16
Plan Total Prescribed Dose: 42.56 Gy
Reference Point Dosage Given to Date: 37.24 Gy
Reference Point Session Dosage Given: 2.66 Gy
Session Number: 14

## 2024-05-02 ENCOUNTER — Ambulatory Visit
Admission: RE | Admit: 2024-05-02 | Discharge: 2024-05-02 | Disposition: A | Discharge status: Home / Self Care | Source: Ambulatory Visit | Attending: Radiation Oncology | Admitting: Radiation Oncology

## 2024-05-02 DIAGNOSIS — Z170 Estrogen receptor positive status [ER+]: Secondary | ICD-10-CM | POA: Diagnosis present

## 2024-05-02 DIAGNOSIS — C50412 Malignant neoplasm of upper-outer quadrant of left female breast: Secondary | ICD-10-CM | POA: Diagnosis present

## 2024-05-02 DIAGNOSIS — Z510 Encounter for antineoplastic radiation therapy: Secondary | ICD-10-CM | POA: Diagnosis present

## 2024-05-03 ENCOUNTER — Other Ambulatory Visit: Payer: Self-pay

## 2024-05-03 DIAGNOSIS — Z510 Encounter for antineoplastic radiation therapy: Secondary | ICD-10-CM | POA: Diagnosis not present

## 2024-05-03 LAB — RAD ONC ARIA SESSION SUMMARY
Course Elapsed Days: 21
Plan Fractions Treated to Date: 15
Plan Prescribed Dose Per Fraction: 2.66 Gy
Plan Total Fractions Prescribed: 16
Plan Total Prescribed Dose: 42.56 Gy
Reference Point Dosage Given to Date: 39.9 Gy
Reference Point Session Dosage Given: 2.66 Gy
Session Number: 15

## 2024-05-04 ENCOUNTER — Ambulatory Visit
Admission: RE | Admit: 2024-05-04 | Discharge: 2024-05-04 | Disposition: A | Discharge status: Home / Self Care | Source: Ambulatory Visit | Attending: Radiation Oncology

## 2024-05-04 ENCOUNTER — Other Ambulatory Visit: Payer: Self-pay

## 2024-05-04 DIAGNOSIS — Z510 Encounter for antineoplastic radiation therapy: Secondary | ICD-10-CM | POA: Diagnosis not present

## 2024-05-04 LAB — RAD ONC ARIA SESSION SUMMARY
Course Elapsed Days: 22
Plan Fractions Treated to Date: 16
Plan Prescribed Dose Per Fraction: 2.66 Gy
Plan Total Fractions Prescribed: 16
Plan Total Prescribed Dose: 42.56 Gy
Reference Point Dosage Given to Date: 42.56 Gy
Reference Point Session Dosage Given: 2.66 Gy
Session Number: 16

## 2024-05-05 ENCOUNTER — Ambulatory Visit
Admission: RE | Admit: 2024-05-05 | Discharge: 2024-05-05 | Disposition: A | Discharge status: Home / Self Care | Source: Ambulatory Visit | Attending: Radiation Oncology

## 2024-05-05 ENCOUNTER — Other Ambulatory Visit: Payer: Self-pay

## 2024-05-05 DIAGNOSIS — Z510 Encounter for antineoplastic radiation therapy: Secondary | ICD-10-CM | POA: Diagnosis not present

## 2024-05-05 LAB — RAD ONC ARIA SESSION SUMMARY
Course Elapsed Days: 23
Plan Fractions Treated to Date: 1
Plan Prescribed Dose Per Fraction: 2 Gy
Plan Total Fractions Prescribed: 4
Plan Total Prescribed Dose: 8 Gy
Reference Point Dosage Given to Date: 2 Gy
Reference Point Session Dosage Given: 2 Gy
Session Number: 17

## 2024-05-06 ENCOUNTER — Telehealth: Payer: Self-pay | Admitting: Adult Health

## 2024-05-06 ENCOUNTER — Ambulatory Visit
Admission: RE | Admit: 2024-05-06 | Discharge: 2024-05-06 | Disposition: A | Discharge status: Home / Self Care | Source: Ambulatory Visit | Attending: Radiation Oncology | Admitting: Radiation Oncology

## 2024-05-06 ENCOUNTER — Other Ambulatory Visit: Payer: Self-pay

## 2024-05-06 DIAGNOSIS — Z510 Encounter for antineoplastic radiation therapy: Secondary | ICD-10-CM | POA: Diagnosis not present

## 2024-05-06 LAB — RAD ONC ARIA SESSION SUMMARY
Course Elapsed Days: 24
Plan Fractions Treated to Date: 2
Plan Prescribed Dose Per Fraction: 2 Gy
Plan Total Fractions Prescribed: 4
Plan Total Prescribed Dose: 8 Gy
Reference Point Dosage Given to Date: 4 Gy
Reference Point Session Dosage Given: 2 Gy
Session Number: 18

## 2024-05-06 NOTE — Telephone Encounter (Signed)
 Scheduled patient for Survivorship. Spoke with patient and she is aware of appointment.

## 2024-05-09 ENCOUNTER — Ambulatory Visit
Admission: RE | Admit: 2024-05-09 | Discharge: 2024-05-09 | Disposition: A | Discharge status: Home / Self Care | Source: Ambulatory Visit | Attending: Radiation Oncology | Admitting: Radiation Oncology

## 2024-05-09 ENCOUNTER — Other Ambulatory Visit: Payer: Self-pay

## 2024-05-09 DIAGNOSIS — Z510 Encounter for antineoplastic radiation therapy: Secondary | ICD-10-CM | POA: Diagnosis not present

## 2024-05-09 LAB — RAD ONC ARIA SESSION SUMMARY
Course Elapsed Days: 27
Plan Fractions Treated to Date: 3
Plan Prescribed Dose Per Fraction: 2 Gy
Plan Total Fractions Prescribed: 4
Plan Total Prescribed Dose: 8 Gy
Reference Point Dosage Given to Date: 6 Gy
Reference Point Session Dosage Given: 2 Gy
Session Number: 19

## 2024-05-10 ENCOUNTER — Other Ambulatory Visit: Payer: Self-pay

## 2024-05-10 ENCOUNTER — Ambulatory Visit
Admission: RE | Admit: 2024-05-10 | Discharge: 2024-05-10 | Disposition: A | Discharge status: Home / Self Care | Source: Ambulatory Visit | Attending: Radiation Oncology

## 2024-05-10 ENCOUNTER — Inpatient Hospital Stay: Attending: Hematology and Oncology | Admitting: Hematology and Oncology

## 2024-05-10 VITALS — BP 118/88 | HR 79 | Temp 97.6°F | Resp 18 | Ht 63.0 in | Wt 160.1 lb

## 2024-05-10 DIAGNOSIS — Z170 Estrogen receptor positive status [ER+]: Secondary | ICD-10-CM | POA: Diagnosis not present

## 2024-05-10 DIAGNOSIS — C50412 Malignant neoplasm of upper-outer quadrant of left female breast: Secondary | ICD-10-CM | POA: Insufficient documentation

## 2024-05-10 DIAGNOSIS — Z510 Encounter for antineoplastic radiation therapy: Secondary | ICD-10-CM | POA: Diagnosis not present

## 2024-05-10 LAB — RAD ONC ARIA SESSION SUMMARY
Course Elapsed Days: 28
Plan Fractions Treated to Date: 4
Plan Prescribed Dose Per Fraction: 2 Gy
Plan Total Fractions Prescribed: 4
Plan Total Prescribed Dose: 8 Gy
Reference Point Dosage Given to Date: 8 Gy
Reference Point Session Dosage Given: 2 Gy
Session Number: 20

## 2024-05-10 MED ORDER — ANASTROZOLE 1 MG PO TABS: 1.0000 mg | ORAL_TABLET | Freq: Every day | ORAL | 3 refills | Status: AC

## 2024-05-10 NOTE — Assessment & Plan Note (Signed)
 01/28/2024:Screening mammogram detected left breast mass at 3 o'clock position 0.8 cm: Biopsy: Grade 2 IDC ER 100%, PR 100%, Ki67 2%, HER2 equivocal by IHC FISH negative, posterior left breast calcifications 1.7 cm: Biopsy: Benign concordant, axilla negative    Recommendations: 1.  03/01/2024:Left lumpectomy: Grade 2 IDC 1 cm, margins negative, LVI not identified, 0/7 lymph nodes negative, ER 100%, PR 100%, HER2 negative, Ki67 2%  2. Oncotype DX: Score 6 (risk of distant recurrence 3%) 3. Adjuvant radiation therapy 04/13/2024-05/10/2024 4. Adjuvant antiestrogen therapy with anastrozole  1 mg daily started 05/22/2024 ------------------------------------- Anastrozole  counseling: We discussed the risks and benefits of anti-estrogen therapy with aromatase inhibitors. These include but not limited to insomnia, hot flashes, mood changes, vaginal dryness, bone density loss, and weight gain. We strongly believe that the benefits far outweigh the risks. Patient understands these risks and consented to starting treatment. Planned treatment duration is 7 years.  Return to clinic in 3 months for survivorship care plan visit

## 2024-05-10 NOTE — Progress Notes (Signed)
 Patient Care Team: Clarice Nottingham, MD as PCP - General (Internal Medicine) Odean Potts, MD as Consulting Physician (Hematology and Oncology) Vanderbilt Ned, MD as Consulting Physician (General Surgery) Dewey Rush, MD as Consulting Physician (Radiation Oncology) Glean Stephane BROCKS, RN (Inactive) as Oncology Nurse Navigator Tyree Nanetta SAILOR, RN as Oncology Nurse Navigator  DIAGNOSIS:  Encounter Diagnosis  Name Primary?   Malignant neoplasm of upper-outer quadrant of left breast in female, estrogen receptor positive (HCC) Yes    SUMMARY OF ONCOLOGIC HISTORY: Oncology History  Malignant neoplasm of upper-outer quadrant of left breast in female, estrogen receptor positive (HCC)  01/28/2024 Initial Diagnosis   Screening mammogram detected left breast mass at 3 o'clock position 0.8 cm: Biopsy: Grade 2 IDC ER 100%, PR 100%, Ki67 2%, HER2 equivocal by IHC FISH negative, posterior left breast calcifications 1.7 cm: Biopsy: Benign concordant, axilla negative   02/03/2024 Cancer Staging   Staging form: Breast, AJCC 8th Edition - Clinical: Stage IA (cT1b, cN0, cM0, G2, ER+, PR+, HER2-) - Signed by Odean Potts, MD on 02/03/2024 Stage prefix: Initial diagnosis Histologic grading system: 3 grade system    Genetic Testing   [facility] CancerNext-Expanded Panel was Negative. 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 Report date is 02/22/2024.  The CancerNext-Expanded gene panel offered by [facility] and includes sequencing and rearrangement for the following 77 genes: AIP, ALK, APC, ATM, AXIN2, BAP1, BARD1, BMPR1A, BRCA1, BRCA2, BRIP1, CDC73, CDH1, CDK4, CDKN1B, CDKN2A, CEBPA, CHEK2, CTNNA1, DDX41, DICER1, ETV6, FH, FLCN, GATA2, LZTR1, MAX, MBD4, MEN1, MET, MLH1, MSH2, MSH3, MSH6, MUTYH, NF1, NF2, NTHL1, PALB2, PHOX2B, PMS2, POT1, PRKAR1A, PTCH1, PTEN, RAD51C, RAD51D, RB1, RET, RPS20, RUNX1, SDHA, SDHAF2, SDHB, SDHC, SDHD, SMAD4, SMARCA4, SMARCB1, SMARCE1, STK11,  SUFU, TMEM127, TP53, TSC1, TSC2, VHL, and WT1 (sequencing and deletion/duplication); EGFR, HOXB13, KIT, MITF, PDGFRA, POLD1, and POLE (sequencing only); EPCAM and GREM1 (deletion/duplication only).    03/01/2024 Surgery   Left lumpectomy: Grade 2 IDC 1 cm, margins negative, LVI not identified, 0/7 lymph nodes negative, ER 100%, PR 100%, HER2 negative, Ki67 2%   04/13/2024 - 05/10/2024 Radiation Therapy   Adjuvant radiation     CHIEF COMPLIANT: Follow-up after radiation therapy  HISTORY OF PRESENT ILLNESS:  History of Present Illness Jacqueline Mayer is a 58 year old female with breast cancer who presents for follow-up after completing radiation therapy.  She experiences skin discoloration and itchiness following the completion of radiation therapy. These symptoms are expected to resolve in the coming weeks. She is scheduled to begin anastrozole  at a dose of 1 mg daily for five to seven years. She is currently taking Lipitor for cholesterol management.     ALLERGIES:  is allergic to bactrim [sulfamethoxazole-trimethoprim], bee venom, and sulfa antibiotics.  MEDICATIONS:  Current Outpatient Medications  Medication Sig Dispense Refill   ALPRAZolam (XANAX) 0.25 MG tablet Take 0.25 mg by mouth daily as needed.     anastrozole  (ARIMIDEX ) 1 MG tablet Take 1 tablet (1 mg total) by mouth daily. 90 tablet 3   atorvastatin (LIPITOR) 20 MG tablet Take 1 tablet by mouth daily.     bisoprolol-hydrochlorothiazide (ZIAC) 10-6.25 MG tablet Take 1 tablet by mouth daily.     EPINEPHrine  0.3 mg/0.3 mL IJ SOAJ injection Inject 0.3 mg into the muscle as needed for anaphylaxis. 1 each 1   ezetimibe (ZETIA) 10 MG tablet Take 1 tablet by mouth daily.     ibuprofen  (ADVIL ) 800 MG tablet Take 1 tablet (800  mg total) by mouth every 8 (eight) hours as needed. 30 tablet 0   levothyroxine (SYNTHROID) 88 MCG tablet Take 75 mcg by mouth every morning.     tirzepatide (MOUNJARO) 7.5 MG/0.5ML Pen Inject 7.5 mg into the skin  once a week.     traZODone (DESYREL) 50 MG tablet Take 50 mg by mouth at bedtime.     Current Facility-Administered Medications  Medication Dose Route Frequency Provider Last Rate Last Admin   0.9 %  sodium chloride  infusion  500 mL Intravenous Once Eda Iha, MD        PHYSICAL EXAMINATION: ECOG PERFORMANCE STATUS: 1 - Symptomatic but completely ambulatory  Vitals:   05/10/24 1523  BP: 118/88  Pulse: 79  Resp: 18  Temp: 97.6 F (36.4 C)  SpO2: 100%   Filed Weights   05/10/24 1523  Weight: 160 lb 1.6 oz (72.6 kg)    LABORATORY DATA:  I have reviewed the data as listed    Latest Ref Rng & Units 02/03/2024   12:13 PM  CMP  Glucose 70 - 99 mg/dL 97   BUN 6 - 20 mg/dL 15   Creatinine 9.55 - 1.00 mg/dL 9.16   Sodium 864 - 854 mmol/L 139   Potassium 3.5 - 5.1 mmol/L 3.9   Chloride 98 - 111 mmol/L 104   CO2 22 - 32 mmol/L 28   Calcium  8.9 - 10.3 mg/dL 89.9   Total Protein 6.5 - 8.1 g/dL 8.1   Total Bilirubin 0.0 - 1.2 mg/dL 0.7   Alkaline Phos 38 - 126 U/L 56   AST 15 - 41 U/L 19   ALT 0 - 44 U/L 18     Lab Results  Component Value Date   WBC 8.6 02/03/2024   HGB 13.8 02/03/2024   HCT 40.4 02/03/2024   MCV 87.8 02/03/2024   PLT 234 02/03/2024   NEUTROABS 5.8 02/03/2024    ASSESSMENT & PLAN:  Malignant neoplasm of upper-outer quadrant of left breast in female, estrogen receptor positive (HCC) 01/28/2024:Screening mammogram detected left breast mass at 3 o'clock position 0.8 cm: Biopsy: Grade 2 IDC ER 100%, PR 100%, Ki67 2%, HER2 equivocal by IHC FISH negative, posterior left breast calcifications 1.7 cm: Biopsy: Benign concordant, axilla negative    Recommendations: 1.  03/01/2024:Left lumpectomy: Grade 2 IDC 1 cm, margins negative, LVI not identified, 0/7 lymph nodes negative, ER 100%, PR 100%, HER2 negative, Ki67 2%  2. Oncotype DX: Score 6 (risk of distant recurrence 3%) 3. Adjuvant radiation therapy 04/13/2024-05/10/2024 4. Adjuvant antiestrogen therapy  with anastrozole  1 mg daily started 05/22/2024 ------------------------------------- Anastrozole  counseling: We discussed the risks and benefits of anti-estrogen therapy with aromatase inhibitors. These include but not limited to insomnia, hot flashes, mood changes, vaginal dryness, bone density loss, and weight gain. We strongly believe that the benefits far outweigh the risks. Patient understands these risks and consented to starting treatment. Planned treatment duration is 7 years.  Return to clinic in 3 months for survivorship care plan visit    No orders of the defined types were placed in this encounter.  The patient has a good understanding of the overall plan. she agrees with it. she will call with any problems that may develop before the next visit here. Total time spent: 30 mins including face to face time and time spent for planning, charting and co-ordination of care   Naomi MARLA Chad, MD 05/10/24

## 2024-05-11 NOTE — Radiation Completion Notes (Addendum)
  Radiation Oncology         [phone] ________________________________  Name: Jacqueline Mayer MRN: [id number]  Date of Service: 05/10/2024  DOB: 1966/08/05  End of Treatment Note   Diagnosis: Stage IA, pT1bN0M0, grade 2, ER/PR positive invasive ductal carcinoma of the left breast.   Intent: Curative     ==========DELIVERED PLANS==========  First Treatment Date: 2024-04-12 Last Treatment Date: 2024-05-10   Plan Name: Breast_L_BH Site: Breast, Left Technique: 3D Mode: Photon Dose Per Fraction: 2.66 Gy Prescribed Dose (Delivered / Prescribed): 42.56 Gy / 42.56 Gy Prescribed Fxs (Delivered / Prescribed): 16 / 16   Plan Name: Brst_L_Bst_BH Site: Breast, Left Technique: 3D Mode: Photon Dose Per Fraction: 2 Gy Prescribed Dose (Delivered / Prescribed): 8 Gy / 8 Gy Prescribed Fxs (Delivered / Prescribed): 4 / 4     ==========ON TREATMENT VISIT DATES========== 2024-04-15, 2024-04-22, 2024-04-29, 2024-05-06    See weekly On Treatment Notes in Epic for details in the Media tab (listed as Progress notes on the On Treatment Visit Dates listed above). The patient tolerated radiation. She developed fatigue and anticipated skin changes in the treatment field.   The patient will receive a call in about one month from the radiation oncology department. She will continue follow up with Dr. Gudena as well.      Donald KYM Husband, PAC

## 2024-06-10 NOTE — Progress Notes (Signed)
  Radiation Oncology         [phone] ________________________________  Name: Jacqueline Mayer MRN: [id number]  Date of Service: 06/13/2024  DOB: Jan 26, 1966  Post Treatment Telephone Note  Diagnosis:   Stage IA, pT1bN0M0, grade 2, ER/PR positive invasive ductal carcinoma of the left breast.   First Treatment Date: 2024-04-12 Last Treatment Date: 2024-05-10   Plan Name: Breast_L_BH Site: Breast, Left Technique: 3D Mode: Photon Dose Per Fraction: 2.66 Gy Prescribed Dose (Delivered / Prescribed): 42.56 Gy / 42.56 Gy Prescribed Fxs (Delivered / Prescribed): 16 / 16   Plan Name: Brst_L_Bst_BH Site: Breast, Left Technique: 3D Mode: Photon Dose Per Fraction: 2 Gy Prescribed Dose (Delivered / Prescribed): 8 Gy / 8 Gy Prescribed Fxs (Delivered / Prescribed): 4 / 4  The patient was available for call today.   Symptoms of fatigue have improved since completing therapy.  Symptoms of skin changes have improved since completing therapy.  The patient was encouraged to avoid sun exposure in the area of prior treatment for up to one year following radiation with either sunscreen or by the style of clothing worn in the sun.  The patient has scheduled follow up with her medical oncologist Dr. Gudena for ongoing surveillance, and was encouraged to call if she develops concerns or questions regarding radiation.

## 2024-06-13 ENCOUNTER — Ambulatory Visit
Admission: RE | Admit: 2024-06-13 | Discharge: 2024-06-13 | Disposition: A | Discharge status: Home / Self Care | Source: Ambulatory Visit | Attending: Radiation Oncology | Admitting: Radiation Oncology

## 2024-06-13 DIAGNOSIS — Z170 Estrogen receptor positive status [ER+]: Secondary | ICD-10-CM

## 2024-06-27 ENCOUNTER — Ambulatory Visit: Payer: Self-pay | Attending: Surgery

## 2024-06-27 VITALS — Wt 155.2 lb

## 2024-06-27 DIAGNOSIS — Z483 Aftercare following surgery for neoplasm: Secondary | ICD-10-CM | POA: Insufficient documentation

## 2024-06-27 NOTE — Therapy (Signed)
 OUTPATIENT PHYSICAL THERAPY SOZO SCREENING NOTE   Patient Name: Jacqueline Mayer MRN: [id number] DOB:[birth year], 58 y.o., female Today's Date: 06/27/2024  PCP: Clarice Nottingham, MD REFERRING PROVIDER: Vanderbilt Ned, MD   PT End of Session - 06/27/24 [phone]     Visit Number 2   # unchanged due to screen only   PT Start Time 0844    PT Stop Time 0848    PT Time Calculation (min) 4 min    Activity Tolerance Patient tolerated treatment well    Behavior During Therapy [facility] for tasks assessed/performed          Past Medical History:  Diagnosis Date   Breast cancer (HCC)    left breast IDC   Hyperlipidemia    Hypertension    Pre-diabetes    Thyroid  disease    Past Surgical History:  Procedure Laterality Date   BREAST BIOPSY Left 01/22/2024   US  LT BREAST BX W LOC DEV 1ST LESION IMG BX SPEC US  GUIDE 01/22/2024 [facility]   BREAST BIOPSY Left 01/28/2024   MM LT BREAST BX W LOC DEV 1ST LESION IMAGE BX SPEC STEREO GUIDE 01/28/2024 [facility]   BREAST BIOPSY  02/26/2024   MM LT RADIOACTIVE SEED LOC MAMMO GUIDE 02/26/2024 [facility]   BREAST LUMPECTOMY WITH RADIOACTIVE SEED AND SENTINEL LYMPH NODE BIOPSY Left 03/01/2024   Procedure: LEFT BREAST LUMPECTOMY WITH RADIOACTIVE SEED AND SENTINEL LYMPH NODE BIOPSY;  Surgeon: Vanderbilt Ned, MD;  Location: [facility];  Service: General;  Laterality: Left;  LEFT BREAST SEED LUMPECTOMY LEFT AXILLARY SENTINEL LYMPH NODE MAPPING   CESAREAN SECTION     COLONOSCOPY  03/29/2021   2003   TONSILECTOMY, ADENOIDECTOMY, BILATERAL MYRINGOTOMY AND TUBES     Patient Active Problem List   Diagnosis Date Noted   Genetic testing 02/15/2024   Malignant neoplasm of upper-outer quadrant of left breast in female, estrogen receptor positive (HCC) 02/01/2024    REFERRING DIAG: left breast cancer at risk for lymphedema  THERAPY DIAG: Aftercare following surgery for neoplasm  PERTINENT HISTORY: Post Left lumpectomy with SLNB  03/01/24. 7 negative lymph nodes removed.  Patient was diagnosed on 01/08/2024 with left grade 2 invasive ductal carcinoma breast cancer. It measures 8 mm and is located in the upper outer quadrant. It is ER/PR positive and HER2 negative with a Ki67 of 2%. She has a history of Graves disease treated with oral radiation. She has also been diagnosed with ankylosing spondylosis in 2023.   PRECAUTIONS: left UE Lymphedema risk, None  SUBJECTIVE: Pt returns for her first 3 month L-Dex screen.   PAIN:  Are you having pain? No  SOZO SCREENING: Patient was assessed today using the SOZO machine to determine the lymphedema index score. This was compared to her baseline score. It was determined that she is within the recommended range when compared to her baseline and no further action is needed at this time. She will continue SOZO screenings. These are done every 3 months for 2 years post operatively followed by every 6 months for 2 years, and then annually.   L-DEX FLOWSHEETS - 06/27/24 0800       L-DEX LYMPHEDEMA SCREENING   Measurement Type Unilateral    L-DEX MEASUREMENT EXTREMITY Upper Extremity    POSITION  Standing    DOMINANT SIDE Right    At Risk Side Left    BASELINE SCORE (UNILATERAL) 2.9    L-DEX SCORE (UNILATERAL) 1.1    VALUE CHANGE (UNILAT) -1.8  Aden Berwyn Caldron, PTA 06/27/2024, 8:49 AM

## 2024-07-05 ENCOUNTER — Other Ambulatory Visit: Payer: Self-pay | Admitting: *Deleted

## 2024-07-05 ENCOUNTER — Telehealth: Payer: Self-pay | Admitting: *Deleted

## 2024-07-05 DIAGNOSIS — N6320 Unspecified lump in the left breast, unspecified quadrant: Secondary | ICD-10-CM

## 2024-07-05 DIAGNOSIS — C50412 Malignant neoplasm of upper-outer quadrant of left female breast: Secondary | ICD-10-CM

## 2024-07-05 NOTE — Telephone Encounter (Signed)
 Received call from patient regarding a pea size lump she felt under her lumpectomy scar.  She is concerned as she just finished xrt.  Orders placed for diag mammo/us  and explained someone would call with an appt.

## 2024-07-06 ENCOUNTER — Ambulatory Visit
Admission: RE | Admit: 2024-07-06 | Discharge: 2024-07-06 | Disposition: A | Discharge status: Home / Self Care | Source: Ambulatory Visit | Attending: Hematology and Oncology | Admitting: Hematology and Oncology

## 2024-07-06 ENCOUNTER — Other Ambulatory Visit: Payer: Self-pay | Admitting: Hematology and Oncology

## 2024-07-06 DIAGNOSIS — C50412 Malignant neoplasm of upper-outer quadrant of left female breast: Secondary | ICD-10-CM

## 2024-07-06 DIAGNOSIS — N6320 Unspecified lump in the left breast, unspecified quadrant: Secondary | ICD-10-CM

## 2024-09-05 ENCOUNTER — Inpatient Hospital Stay

## 2024-09-05 ENCOUNTER — Encounter: Payer: Self-pay | Admitting: Adult Health

## 2024-09-05 ENCOUNTER — Inpatient Hospital Stay: Attending: Adult Health | Admitting: Adult Health

## 2024-09-05 VITALS — BP 119/79 | HR 74 | Temp 97.8°F | Resp 18 | Ht 63.0 in | Wt 147.4 lb

## 2024-09-05 DIAGNOSIS — Z1379 Encounter for other screening for genetic and chromosomal anomalies: Secondary | ICD-10-CM

## 2024-09-05 DIAGNOSIS — Z13820 Encounter for screening for osteoporosis: Secondary | ICD-10-CM

## 2024-09-05 DIAGNOSIS — Z79811 Long term (current) use of aromatase inhibitors: Secondary | ICD-10-CM | POA: Diagnosis not present

## 2024-09-05 MED ORDER — MINOXIDIL 2.5 MG PO TABS: 2.5000 mg | ORAL_TABLET | Freq: Every day | ORAL | 1 refills | Status: AC

## 2024-09-05 NOTE — Progress Notes (Signed)
 SURVIVORSHIP VISIT:  BRIEF ONCOLOGIC HISTORY:  Oncology History  Malignant neoplasm of upper-outer quadrant of left breast in female, estrogen receptor positive (HCC)  01/28/2024 Initial Diagnosis   Screening mammogram detected left breast mass at 3 o'clock position 0.8 cm: Biopsy: Grade 2 IDC ER 100%, PR 100%, Ki67 2%, HER2 equivocal by IHC FISH negative, posterior left breast calcifications 1.7 cm: Biopsy: Benign concordant, axilla negative   02/03/2024 Cancer Staging   Staging form: Breast, AJCC 8th Edition - Clinical: Stage IA (cT1b, cN0, cM0, G2, ER+, PR+, HER2-) - Signed by Odean Potts, MD on 02/03/2024 Stage prefix: Initial diagnosis Histologic grading system: 3 grade system    Genetic Testing   [facility] CancerNext-Expanded Panel was Negative. 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 Report date is 02/22/2024.  The CancerNext-Expanded gene panel offered by [facility] and includes sequencing and rearrangement for the following 77 genes: AIP, ALK, APC, ATM, AXIN2, BAP1, BARD1, BMPR1A, BRCA1, BRCA2, BRIP1, CDC73, CDH1, CDK4, CDKN1B, CDKN2A, CEBPA, CHEK2, CTNNA1, DDX41, DICER1, ETV6, FH, FLCN, GATA2, LZTR1, MAX, MBD4, MEN1, MET, MLH1, MSH2, MSH3, MSH6, MUTYH, NF1, NF2, NTHL1, PALB2, PHOX2B, PMS2, POT1, PRKAR1A, PTCH1, PTEN, RAD51C, RAD51D, RB1, RET, RPS20, RUNX1, SDHA, SDHAF2, SDHB, SDHC, SDHD, SMAD4, SMARCA4, SMARCB1, SMARCE1, STK11, SUFU, TMEM127, TP53, TSC1, TSC2, VHL, and WT1 (sequencing and deletion/duplication); EGFR, HOXB13, KIT, MITF, PDGFRA, POLD1, and POLE (sequencing only); EPCAM and GREM1 (deletion/duplication only).    03/01/2024 Surgery   Left lumpectomy: Grade 2 IDC 1 cm, margins negative, LVI not identified, 0/7 lymph nodes negative, ER 100%, PR 100%, HER2 negative, Ki67 2%   03/01/2024 Oncotype testing   7/3%   04/13/2024 - 05/10/2024 Radiation Therapy   Plan Name: Breast_L_BH Site: Breast, Left Technique: 3D Mode: Photon Dose Per  Fraction: 2.66 Gy Prescribed Dose (Delivered / Prescribed): 42.56 Gy / 42.56 Gy Prescribed Fxs (Delivered / Prescribed): 16 / 16   Plan Name: Brst_L_Bst_BH Site: Breast, Left Technique: 3D Mode: Photon Dose Per Fraction: 2 Gy Prescribed Dose (Delivered / Prescribed): 8 Gy / 8 Gy Prescribed Fxs (Delivered / Prescribed): 4 / 4   05/2024 -  Anti-estrogen oral therapy   Anastrozole    09/05/2024 Cancer Staging   Staging form: Breast, AJCC 8th Edition - Pathologic: Stage IA (pT1c, pN0, cM0, G2, ER+, PR+, HER2-, Oncotype DX score: 7) - Signed by Crawford Morna Pickle, NP on 09/05/2024 Multigene prognostic tests performed: Oncotype DX Recurrence score range: Less than 11 Histologic grading system: 3 grade system     INTERVAL HISTORY:  Discussed the use of AI scribe software for clinical note transcription with the patient, who gave verbal consent to proceed.  History of Present Illness Jacqueline Mayer is a 59 year old female with stage IA ER/PR positive left breast cancer, status post lumpectomy and adjuvant radiation, presenting for routine oncology follow-up and management of treatment-related symptoms.  She is on adjuvant anastrozole  and monitored with SOZO. She has not had bone density testing.   She reports significant hair thinning that she attributes to thyroid  disease Alvia' on levothyroxine), prior radiation, anastrozole , and Mounjaro, with longstanding hair loss during thyroid  fluctuations. She is concerned about regrowth and asks about treatment options..  She has persistent arthralgias and morning stiffness, mainly in her hands and hips, with ongoing hip and foot pain. She has ankylosing spondylitis with pain that briefly improved but then returned after radiation and fluctuates, possibly with weather. She is not on specific therapy for spondylitis. She also has vaginal dryness  and hot flashes that she attributes to anastrozole .    REVIEW OF SYSTEMS:  Review of Systems   Constitutional:  Negative for appetite change, chills, fatigue, fever and unexpected weight change.  HENT:   Negative for hearing loss, lump/mass and trouble swallowing.   Eyes:  Negative for eye problems and icterus.  Respiratory:  Negative for chest tightness, cough and shortness of breath.   Cardiovascular:  Negative for chest pain, leg swelling and palpitations.  Gastrointestinal:  Negative for abdominal distention, abdominal pain, constipation, diarrhea, nausea and vomiting.  Endocrine: Negative for hot flashes.  Genitourinary:  Negative for difficulty urinating.   Musculoskeletal:  Positive for arthralgias.  Skin:  Negative for itching and rash.  Neurological:  Negative for dizziness, extremity weakness, headaches and numbness.  Hematological:  Negative for adenopathy. Does not bruise/bleed easily.  Psychiatric/Behavioral:  Negative for depression. The patient is not nervous/anxious.    Breast: Denies any new nodularity, masses, tenderness, nipple changes, or nipple discharge.       PAST MEDICAL/SURGICAL HISTORY:  Past Medical History:  Diagnosis Date   Breast cancer (HCC)    left breast IDC   Hyperlipidemia    Hypertension    Pre-diabetes    Thyroid  disease    Past Surgical History:  Procedure Laterality Date   BREAST BIOPSY Left 01/22/2024   US  LT BREAST BX W LOC DEV 1ST LESION IMG BX SPEC US  GUIDE 01/22/2024 [facility]   BREAST BIOPSY Left 01/28/2024   MM LT BREAST BX W LOC DEV 1ST LESION IMAGE BX SPEC STEREO GUIDE 01/28/2024 [facility]   BREAST BIOPSY  02/26/2024   MM LT RADIOACTIVE SEED LOC MAMMO GUIDE 02/26/2024 [facility]   BREAST LUMPECTOMY WITH RADIOACTIVE SEED AND SENTINEL LYMPH NODE BIOPSY Left 03/01/2024   Procedure: LEFT BREAST LUMPECTOMY WITH RADIOACTIVE SEED AND SENTINEL LYMPH NODE BIOPSY;  Surgeon: Vanderbilt Ned, MD;  Location: [facility];  Service: General;  Laterality: Left;  LEFT BREAST SEED LUMPECTOMY LEFT AXILLARY  SENTINEL LYMPH NODE MAPPING   CESAREAN SECTION     COLONOSCOPY  03/29/2021   2003   TONSILECTOMY, ADENOIDECTOMY, BILATERAL MYRINGOTOMY AND TUBES       ALLERGIES:  Allergies[1]   CURRENT MEDICATIONS:  Outpatient Encounter Medications as of 09/05/2024  Medication Sig   ALPRAZolam (XANAX) 0.25 MG tablet Take 0.25 mg by mouth daily as needed.   anastrozole  (ARIMIDEX ) 1 MG tablet Take 1 tablet (1 mg total) by mouth daily.   atorvastatin (LIPITOR) 20 MG tablet Take 1 tablet by mouth daily.   bisoprolol-hydrochlorothiazide (ZIAC) 10-6.25 MG tablet Take 1 tablet by mouth daily.   EPINEPHrine  0.3 mg/0.3 mL IJ SOAJ injection Inject 0.3 mg into the muscle as needed for anaphylaxis.   ezetimibe (ZETIA) 10 MG tablet Take 1 tablet by mouth daily.   ibuprofen  (ADVIL ) 800 MG tablet Take 1 tablet (800 mg total) by mouth every 8 (eight) hours as needed.   levothyroxine (SYNTHROID) 88 MCG tablet Take 75 mcg by mouth every morning.   tirzepatide (MOUNJARO) 7.5 MG/0.5ML Pen Inject 7.5 mg into the skin once a week.   traZODone (DESYREL) 50 MG tablet Take 50 mg by mouth at bedtime.   Facility-Administered Encounter Medications as of 09/05/2024  Medication   0.9 %  sodium chloride  infusion     ONCOLOGIC FAMILY HISTORY:  Family History  Problem Relation Age of Onset   Colon polyps Mother    Heart attack Mother    Diabetes Mother    Colon cancer  Mother 7       dx. liver cancer at the same time, unsure which was the primary   Cervical cancer Mother 86   Colon polyps Father    Diabetes Father    Cervical cancer Maternal Aunt 34   Cervical cancer Maternal Grandmother 69   Esophageal cancer Neg Hx    Rectal cancer Neg Hx    Stomach cancer Neg Hx      SOCIAL HISTORY:  Social History   Socioeconomic History   Marital status: Married    Spouse name: Not on file   Number of children: 1   Years of education: Not on file   Highest education level: Not on file  Occupational History   Occupation:  Production Designer, Theatre/television/film at [employer]  Tobacco Use   Smoking status: Never   Smokeless tobacco: Never  Vaping Use   Vaping status: Never Used  Substance and Sexual Activity   Alcohol use: Yes    Alcohol/week: 3.0 standard drinks of alcohol    Types: 3 Glasses of wine per week    Comment: daily liquor   Drug use: Never   Sexual activity: Yes    Birth control/protection: Post-menopausal  Other Topics Concern   Not on file  Social History Narrative   Not on file   Social Drivers of Health   Tobacco Use: Low Risk  (04/04/2024)   Received from [facility]   Patient History    Smoking Tobacco Use: Never    Smokeless Tobacco Use: Never    Passive Exposure: Not on file  Financial Resource Strain: Not on file  Food Insecurity: No Food Insecurity (03/29/2024)   Epic    Worried About Programme Researcher, Broadcasting/film/video in the Last Year: Never true    Ran Out of Food in the Last Year: Never true  Transportation Needs: No Transportation Needs (03/29/2024)   Epic    Lack of Transportation (Medical): No    Lack of Transportation (Non-Medical): No  Physical Activity: Not on file  Stress: Not on file  Social Connections: Unknown (01/09/2022)   Received from [facility]   Social Network    Social Network: Not on file  Intimate Partner Violence: Not At Risk (03/29/2024)   Epic    Fear of Current or Ex-Partner: No    Emotionally Abused: No    Physically Abused: No    Sexually Abused: No  Depression (PHQ2-9): Low Risk (02/03/2024)   Depression (PHQ2-9)    PHQ-2 Score: 0  Alcohol Screen: Not on file  Housing: Low Risk (03/29/2024)   Epic    Unable to Pay for Housing in the Last Year: No    Number of Times Moved in the Last Year: 0    Homeless in the Last Year: No  Utilities: Not At Risk (03/29/2024)   Epic    Threatened with loss of utilities: No  Health Literacy: Not on file     OBSERVATIONS/OBJECTIVE:  BP 119/79 (BP Location: Right Arm, Patient Position: Sitting)   Pulse 74   Temp  97.8 F (36.6 C) (Tympanic)   Resp 18   Ht 5' 3 (1.6 m)   Wt 147 lb 6.4 oz (66.9 kg)   LMP 05/31/2015   SpO2 100%   BMI 26.11 kg/m  GENERAL: Patient is a well appearing female in no acute distress HEENT:  Sclerae anicteric.  Oropharynx clear and moist. No ulcerations or evidence of oropharyngeal candidiasis. Neck is supple.  NODES:  No cervical, supraclavicular,  or axillary lymphadenopathy palpated.  BREAST EXAM:  left breast s/p lumpectomy and radiation, no sign of local recurrence; right breast benign LUNGS:  Clear to auscultation bilaterally.  No wheezes or rhonchi. HEART:  Regular rate and rhythm. No murmur appreciated. ABDOMEN:  Soft, nontender.  Positive, normoactive bowel sounds. No organomegaly palpated. MSK:  No focal spinal tenderness to palpation. Full range of motion bilaterally in the upper extremities. EXTREMITIES:  No peripheral edema.   SKIN:  Clear with no obvious rashes or skin changes. No nail dyscrasia. NEURO:  Nonfocal. Well oriented.  Appropriate affect.   LABORATORY DATA:  None for this visit.  DIAGNOSTIC IMAGING:  None for this visit.      ASSESSMENT AND PLAN:  Jacqueline Mayer is a pleasant 59 y.o. female with Stage IA left breast invasive ductal carcinoma, ER+/PR+/HER2-, diagnosed in 12/2023, treated with lumpectomy, adjuvant radiation therapy, and anti-estrogen therapy with Anastrozole  beginning in 05/2024.  She presents to the Survivorship Clinic for our initial meeting and routine follow-up post-completion of treatment for breast cancer.    1. Stage IA left breast cancer:  Jacqueline Mayer is continuing to recover from definitive treatment for breast cancer. She will follow-up with her medical oncologist, Dr. Odean in 6 months with history and physical exam per surveillance protocol.  She will continue her anti-estrogen therapy with Anastrozole . Thus far, she is tolerating the Anastrozole  well, with minimal side effects. Her mammogram is due 12/2024.  She will undergo  guardant reveal testing every 6 months. Risks and benefits were reviewed with her today.      Today, a comprehensive survivorship care plan and treatment summary was reviewed with the patient today detailing her breast cancer diagnosis, treatment course, potential late/long-term effects of treatment, appropriate follow-up care with recommendations for the future, and patient education resources.  A copy of this summary, along with a letter will be sent to the patients primary care provider via mail/fax/In Basket message after todays visit.    2. Hair thinning: She has exhausted non-pharmacologic interventions.  Oral minoxidil  prescribed.  I screened the minoxidil  with her current med list on open AI and found no interactions.We reviewed risks and benefits and she will let me know if she has any difficulty with the medication.     3. Bone health:  Given Jacqueline Mayer's age/history of breast cancer and her current treatment regimen including anti-estrogen therapy with Anastrozole , she is at risk for bone demineralization.  She has not yet undergone bone density testing.  Orders placed today at [facility].  She was given education on specific activities to promote bone health.  4. Cancer screening:  Due to Jacqueline Mayer's history and her age, she should receive screening for skin cancers, colon cancer, and gynecologic cancers.  The information and recommendations are listed on the patient's comprehensive care plan/treatment summary and were reviewed in detail with the patient.    5. Health maintenance and wellness promotion: Jacqueline Mayer was encouraged to consume 5-7 servings of fruits and vegetables per day. We reviewed the Nutrition Rainbow handout.  She was also encouraged to engage in moderate to vigorous exercise for 30 minutes per day most days of the week.  She was instructed to limit her alcohol consumption and continue to abstain from tobacco use.     6. Support services/counseling: It is not  uncommon for this period of the patient's cancer care trajectory to be one of many emotions and stressors.   She was given information regarding our available services and  encouraged to contact me with any questions or for help enrolling in any of our support group/programs.    Follow up instructions:    -Return to cancer center in 6 months for f/u with Dr. Odean Vickey reveal testing in January and July  -Mammogram due in 12/2024 -DEXA 10/2024 -She is welcome to return back to the Survivorship Clinic at any time; no additional follow-up needed at this time.  -Consider referral back to survivorship as a long-term survivor for continued surveillance  The patient was provided an opportunity to ask questions and all were answered. The patient agreed with the plan and demonstrated an understanding of the instructions.   Total encounter time:60 minutes*in face-to-face visit time, chart review, lab review, care coordination, order entry, and documentation of the encounter time.    Morna Kendall, NP 09/05/2024 10:26 AM Medical Oncology and Hematology [facility] [address] [place], [place] [address] Tel. [phone]    Fax. [phone]  *Total Encounter Time as defined by the Centers for Medicare and Medicaid Services includes, in addition to the face-to-face time of a patient visit (documented in the note above) non-face-to-face time: obtaining and reviewing outside history, ordering and reviewing medications, tests or procedures, care coordination (communications with other health care professionals or caregivers) and documentation in the medical record.     [1]  Allergies Allergen Reactions   Bactrim [Sulfamethoxazole-Trimethoprim]    Bee Venom     Bee stings    Sulfa Antibiotics Swelling

## 2024-09-14 ENCOUNTER — Encounter: Payer: Self-pay | Admitting: Adult Health

## 2024-09-21 LAB — GUARDANT REVEAL

## 2024-09-23 ENCOUNTER — Ambulatory Visit: Attending: Surgery

## 2024-09-23 VITALS — Wt 141.0 lb

## 2024-09-23 DIAGNOSIS — Z483 Aftercare following surgery for neoplasm: Secondary | ICD-10-CM | POA: Insufficient documentation

## 2024-09-23 NOTE — Therapy (Signed)
 " OUTPATIENT PHYSICAL THERAPY SOZO SCREENING NOTE   Patient Name: Jacqueline Mayer MRN: [id number] DOB:[birth year], 59 y.o., female Today's Date: 09/23/2024  PCP: Clarice Nottingham, MD REFERRING PROVIDER: Vanderbilt Ned, MD   PT End of Session - 09/23/24 [phone]     Visit Number 2   # unchanged due to screen only   PT Start Time 0852    PT Stop Time 0857    PT Time Calculation (min) 5 min    Activity Tolerance Patient tolerated treatment well    Behavior During Therapy [facility] for tasks assessed/performed          Past Medical History:  Diagnosis Date   Breast cancer (HCC)    left breast IDC   Hyperlipidemia    Hypertension    Pre-diabetes    Thyroid  disease    Past Surgical History:  Procedure Laterality Date   BREAST BIOPSY Left 01/22/2024   US  LT BREAST BX W LOC DEV 1ST LESION IMG BX SPEC US  GUIDE 01/22/2024 [facility]   BREAST BIOPSY Left 01/28/2024   MM LT BREAST BX W LOC DEV 1ST LESION IMAGE BX SPEC STEREO GUIDE 01/28/2024 [facility]MO[facility]   BREAST BIOPSY  02/26/2024   MM LT RADIOACTIVE SEED LOC MAMMO GUIDE 02/26/2024 [facility]   BREAST LUMPECTOMY WITH RADIOACTIVE SEED AND SENTINEL LYMPH NODE BIOPSY Left 03/01/2024   Procedure: LEFT BREAST LUMPECTOMY WITH RADIOACTIVE SEED AND SENTINEL LYMPH NODE BIOPSY;  Surgeon: Vanderbilt Ned, MD;  Location: [facility];  Service: General;  Laterality: Left;  LEFT BREAST SEED LUMPECTOMY LEFT AXILLARY SENTINEL LYMPH NODE MAPPING   CESAREAN SECTION     COLONOSCOPY  03/29/2021   2003   TONSILECTOMY, ADENOIDECTOMY, BILATERAL MYRINGOTOMY AND TUBES     Patient Active Problem List   Diagnosis Date Noted   Genetic testing 02/15/2024   Malignant neoplasm of upper-outer quadrant of left breast in female, estrogen receptor positive (HCC) 02/01/2024    REFERRING DIAG: left breast cancer at risk for lymphedema  THERAPY DIAG: Aftercare following surgery for neoplasm  PERTINENT HISTORY: Post Left lumpectomy with SLNB  03/01/24. 7 negative lymph nodes removed.  Patient was diagnosed on 01/08/2024 with left grade 2 invasive ductal carcinoma breast cancer. It measures 8 mm and is located in the upper outer quadrant. It is ER/PR positive and HER2 negative with a Ki67 of 2%. She has a history of Graves disease treated with oral radiation. She has also been diagnosed with ankylosing spondylosis in 2023.   PRECAUTIONS: left UE Lymphedema risk, None  SUBJECTIVE: Pt returns for her 3 month L-Dex screen.   PAIN:  Are you having pain? No  SOZO SCREENING: Patient was assessed today using the SOZO machine to determine the lymphedema index score. This was compared to her baseline score. It was determined that she is within the recommended range when compared to her baseline and no further action is needed at this time. She will continue SOZO screenings. These are done every 3 months for 2 years post operatively followed by every 6 months for 2 years, and then annually.   L-DEX FLOWSHEETS - 09/23/24 0800       L-DEX LYMPHEDEMA SCREENING   Measurement Type Unilateral    L-DEX MEASUREMENT EXTREMITY Upper Extremity    POSITION  Standing    DOMINANT SIDE Right    At Risk Side Left    BASELINE SCORE (UNILATERAL) 2.9    L-DEX SCORE (UNILATERAL) -2    VALUE CHANGE (UNILAT) -4.9  P: Cont every 3 month L-Dex screens until 03/2026, then transition to 6 months until 03/2028.   Aden Berwyn Caldron, PTA 09/23/2024, 8:56 AM     "

## 2024-09-26 ENCOUNTER — Ambulatory Visit

## 2024-10-12 ENCOUNTER — Other Ambulatory Visit

## 2024-12-26 ENCOUNTER — Ambulatory Visit: Attending: Surgery

## 2025-01-09 ENCOUNTER — Other Ambulatory Visit

## 2025-01-09 ENCOUNTER — Encounter

## 2025-03-06 ENCOUNTER — Inpatient Hospital Stay

## 2025-03-06 ENCOUNTER — Inpatient Hospital Stay: Admitting: Hematology and Oncology
